# Patient Record
Sex: Female | Born: 1950 | Race: White | Hispanic: No | Marital: Married | Smoking: Never smoker
Health system: Southern US, Community
[De-identification: ages and names within clinical notes are randomized; demographics above are authoritative.]

## PROBLEM LIST (undated history)

## (undated) DIAGNOSIS — E079 Disorder of thyroid, unspecified: Secondary | ICD-10-CM

## (undated) DIAGNOSIS — F419 Anxiety disorder, unspecified: Secondary | ICD-10-CM

## (undated) DIAGNOSIS — E039 Hypothyroidism, unspecified: Secondary | ICD-10-CM

## (undated) DIAGNOSIS — M87076 Idiopathic aseptic necrosis of unspecified foot: Secondary | ICD-10-CM

## (undated) DIAGNOSIS — K219 Gastro-esophageal reflux disease without esophagitis: Secondary | ICD-10-CM

## (undated) DIAGNOSIS — E559 Vitamin D deficiency, unspecified: Secondary | ICD-10-CM

## (undated) DIAGNOSIS — I10 Essential (primary) hypertension: Secondary | ICD-10-CM

## (undated) DIAGNOSIS — M5030 Other cervical disc degeneration, unspecified cervical region: Secondary | ICD-10-CM

## (undated) DIAGNOSIS — M5432 Sciatica, left side: Secondary | ICD-10-CM

## (undated) DIAGNOSIS — E663 Overweight: Secondary | ICD-10-CM

## (undated) DIAGNOSIS — M810 Age-related osteoporosis without current pathological fracture: Secondary | ICD-10-CM

## (undated) DIAGNOSIS — M5431 Sciatica, right side: Secondary | ICD-10-CM

## (undated) DIAGNOSIS — M1711 Unilateral primary osteoarthritis, right knee: Secondary | ICD-10-CM

## (undated) DIAGNOSIS — M5136 Other intervertebral disc degeneration, lumbar region: Secondary | ICD-10-CM

## (undated) DIAGNOSIS — M51369 Other intervertebral disc degeneration, lumbar region without mention of lumbar back pain or lower extremity pain: Secondary | ICD-10-CM

## (undated) DIAGNOSIS — R7301 Impaired fasting glucose: Secondary | ICD-10-CM

## (undated) DIAGNOSIS — E042 Nontoxic multinodular goiter: Secondary | ICD-10-CM

## (undated) DIAGNOSIS — E785 Hyperlipidemia, unspecified: Secondary | ICD-10-CM

## (undated) HISTORY — DX: Overweight: E66.3

## (undated) HISTORY — DX: Other cervical disc degeneration, unspecified cervical region: M50.30

## (undated) HISTORY — DX: Nontoxic multinodular goiter: E04.2

## (undated) HISTORY — PX: MASTOIDECTOMY: SHX711

## (undated) HISTORY — DX: Hypothyroidism, unspecified: E03.9

## (undated) HISTORY — PX: INNER EAR SURGERY: SHX679

## (undated) HISTORY — DX: Sciatica, right side: M54.31

## (undated) HISTORY — PX: COLONOSCOPY: SHX174

## (undated) HISTORY — PX: ABDOMINAL HYSTERECTOMY: SHX81

## (undated) HISTORY — DX: Vitamin D deficiency, unspecified: E55.9

## (undated) HISTORY — PX: APPENDECTOMY: SHX54

## (undated) HISTORY — PX: ESOPHAGOGASTRODUODENOSCOPY: SHX1529

## (undated) HISTORY — PX: THYROIDECTOMY, PARTIAL: SHX18

## (undated) HISTORY — DX: Other intervertebral disc degeneration, lumbar region without mention of lumbar back pain or lower extremity pain: M51.369

## (undated) HISTORY — DX: Unilateral primary osteoarthritis, right knee: M17.11

## (undated) HISTORY — DX: Sciatica, right side: M54.32

## (undated) HISTORY — DX: Idiopathic aseptic necrosis of unspecified foot: M87.076

## (undated) HISTORY — DX: Other intervertebral disc degeneration, lumbar region: M51.36

## (undated) HISTORY — PX: TONSILLECTOMY: SUR1361

## (undated) HISTORY — DX: Age-related osteoporosis without current pathological fracture: M81.0

## (undated) HISTORY — DX: Impaired fasting glucose: R73.01

## (undated) HISTORY — DX: Hyperlipidemia, unspecified: E78.5

## (undated) HISTORY — DX: Anxiety disorder, unspecified: F41.9

---

## 2011-02-06 ENCOUNTER — Emergency Department (HOSPITAL_COMMUNITY): Payer: BC Managed Care – PPO

## 2011-02-06 ENCOUNTER — Emergency Department (HOSPITAL_COMMUNITY)
Admission: EM | Admit: 2011-02-06 | Discharge: 2011-02-06 | Disposition: A | Discharge status: Home / Self Care | Payer: BC Managed Care – PPO | Attending: Emergency Medicine | Admitting: Emergency Medicine

## 2011-02-06 DIAGNOSIS — R0602 Shortness of breath: Secondary | ICD-10-CM | POA: Insufficient documentation

## 2011-02-06 DIAGNOSIS — K219 Gastro-esophageal reflux disease without esophagitis: Secondary | ICD-10-CM | POA: Insufficient documentation

## 2011-02-06 DIAGNOSIS — R059 Cough, unspecified: Secondary | ICD-10-CM | POA: Insufficient documentation

## 2011-02-06 DIAGNOSIS — R071 Chest pain on breathing: Secondary | ICD-10-CM | POA: Insufficient documentation

## 2011-02-06 DIAGNOSIS — E785 Hyperlipidemia, unspecified: Secondary | ICD-10-CM | POA: Insufficient documentation

## 2011-02-06 DIAGNOSIS — R062 Wheezing: Secondary | ICD-10-CM | POA: Insufficient documentation

## 2011-02-06 DIAGNOSIS — Z79899 Other long term (current) drug therapy: Secondary | ICD-10-CM | POA: Insufficient documentation

## 2011-02-06 DIAGNOSIS — R05 Cough: Secondary | ICD-10-CM | POA: Insufficient documentation

## 2011-02-06 LAB — CBC
HCT: 40.7 % (ref 36.0–46.0)
Hemoglobin: 14.6 g/dL (ref 12.0–15.0)
MCH: 31.4 pg (ref 26.0–34.0)
MCHC: 35.9 g/dL (ref 30.0–36.0)
MCV: 87.5 fL (ref 78.0–100.0)

## 2011-02-06 LAB — COMPREHENSIVE METABOLIC PANEL
ALT: 28 U/L (ref 0–35)
AST: 22 U/L (ref 0–37)
Albumin: 4.6 g/dL (ref 3.5–5.2)
CO2: 24 mEq/L (ref 19–32)
Chloride: 103 mEq/L (ref 96–112)
GFR calc non Af Amer: >60 mL/min (ref >60)
Potassium: 3.4 mEq/L — ABNORMAL LOW (ref 3.5–5.1)
Sodium: 140 mEq/L (ref 135–145)
Total Bilirubin: 0.6 mg/dL (ref 0.3–1.2)

## 2011-02-06 LAB — DIFFERENTIAL
Basophils Relative: 0 % (ref 0–1)
Lymphocytes Relative: 27 % (ref 12–46)
Lymphs Abs: 1.9 10*3/uL (ref 0.7–4.0)
Monocytes Absolute: 0.4 10*3/uL (ref 0.1–1.0)
Monocytes Relative: 6 % (ref 3–12)
Neutro Abs: 4.7 10*3/uL (ref 1.7–7.7)

## 2011-02-06 LAB — CK TOTAL AND CKMB (NOT AT ARMC): Relative Index: INVALID (ref 0.0–2.5)

## 2011-02-06 LAB — TROPONIN I: Troponin I: <0.3 ng/mL (ref <0.30)

## 2011-02-06 MED ORDER — IOHEXOL 350 MG/ML SOLN
100.0000 mL | Freq: Once | INTRAVENOUS | Status: AC | PRN
  Administered 2011-02-06: 100 mL via INTRAVENOUS

## 2017-12-13 ENCOUNTER — Emergency Department (HOSPITAL_COMMUNITY): Payer: Medicare Other

## 2017-12-13 ENCOUNTER — Other Ambulatory Visit: Payer: Self-pay

## 2017-12-13 ENCOUNTER — Emergency Department (HOSPITAL_COMMUNITY)
Admission: EM | Admit: 2017-12-13 | Discharge: 2017-12-13 | Disposition: A | Discharge status: Home / Self Care | Payer: Medicare Other | Attending: Emergency Medicine | Admitting: Emergency Medicine

## 2017-12-13 ENCOUNTER — Encounter (HOSPITAL_COMMUNITY): Payer: Self-pay

## 2017-12-13 DIAGNOSIS — I10 Essential (primary) hypertension: Secondary | ICD-10-CM | POA: Diagnosis not present

## 2017-12-13 DIAGNOSIS — R1010 Upper abdominal pain, unspecified: Secondary | ICD-10-CM | POA: Diagnosis present

## 2017-12-13 HISTORY — DX: Disorder of thyroid, unspecified: E07.9

## 2017-12-13 HISTORY — DX: Gastro-esophageal reflux disease without esophagitis: K21.9

## 2017-12-13 HISTORY — DX: Essential (primary) hypertension: I10

## 2017-12-13 LAB — URINALYSIS, ROUTINE W REFLEX MICROSCOPIC
Bilirubin Urine: NEGATIVE
GLUCOSE, UA: NEGATIVE mg/dL
HGB URINE DIPSTICK: NEGATIVE
Ketones, ur: NEGATIVE mg/dL
Leukocytes, UA: NEGATIVE
Nitrite: NEGATIVE
Protein, ur: NEGATIVE mg/dL
SPECIFIC GRAVITY, URINE: 1.003 — AB (ref 1.005–1.030)
pH: 7 (ref 5.0–8.0)

## 2017-12-13 LAB — COMPREHENSIVE METABOLIC PANEL
ALBUMIN: 3.9 g/dL (ref 3.5–5.0)
ALT: 67 U/L — ABNORMAL HIGH (ref 14–54)
ANION GAP: 11 (ref 5–15)
AST: 29 U/L (ref 15–41)
Alkaline Phosphatase: 60 U/L (ref 38–126)
BUN: 12 mg/dL (ref 6–20)
CO2: 23 mmol/L (ref 22–32)
Calcium: 9.4 mg/dL (ref 8.9–10.3)
Chloride: 105 mmol/L (ref 101–111)
Creatinine, Ser: 0.69 mg/dL (ref 0.44–1.00)
GFR calc non Af Amer: >60 mL/min (ref >60)
GLUCOSE: 88 mg/dL (ref 65–99)
Potassium: 3.3 mmol/L — ABNORMAL LOW (ref 3.5–5.1)
SODIUM: 139 mmol/L (ref 135–145)
Total Bilirubin: 1 mg/dL (ref 0.3–1.2)
Total Protein: 7 g/dL (ref 6.5–8.1)

## 2017-12-13 LAB — CBC
HCT: 38.5 % (ref 36.0–46.0)
HEMOGLOBIN: 12.9 g/dL (ref 12.0–15.0)
MCH: 30.4 pg (ref 26.0–34.0)
MCHC: 33.5 g/dL (ref 30.0–36.0)
MCV: 90.6 fL (ref 78.0–100.0)
Platelets: 304 10*3/uL (ref 150–400)
RBC: 4.25 MIL/uL (ref 3.87–5.11)
RDW: 12.6 % (ref 11.5–15.5)
WBC: 11.9 10*3/uL — ABNORMAL HIGH (ref 4.0–10.5)

## 2017-12-13 LAB — LIPASE, BLOOD: Lipase: 37 U/L (ref 11–51)

## 2017-12-13 MED ORDER — SODIUM CHLORIDE 0.9 % IV BOLUS
1000.0000 mL | Freq: Once | INTRAVENOUS | Status: AC
  Administered 2017-12-13: 1000 mL via INTRAVENOUS

## 2017-12-13 MED ORDER — MORPHINE SULFATE (PF) 4 MG/ML IV SOLN
4.0000 mg | Freq: Once | INTRAVENOUS | Status: AC
  Administered 2017-12-13: 4 mg via INTRAVENOUS
  Filled 2017-12-13: qty 1

## 2017-12-13 MED ORDER — METOCLOPRAMIDE HCL 5 MG/ML IJ SOLN
10.0000 mg | Freq: Once | INTRAMUSCULAR | Status: AC
  Administered 2017-12-13: 10 mg via INTRAVENOUS
  Filled 2017-12-13: qty 2

## 2017-12-13 NOTE — ED Provider Notes (Signed)
[facility] EMERGENCY DEPARTMENT Provider Note   CSN: [id number] Arrival date & time: 12/13/17  0747     History   Chief Complaint Chief Complaint  Patient presents with  . Abdominal Pain    HPI Donna Dunn is a 67 y.o. female.  HPI   Donna Dunn is a 67 y.o. female who presents to the Emergency Department complaining of diffuse upper abdominal pain that began yesterday.  She describes the pain as constant and radiating across her upper abdomen.  Yesterday the pain radiated into her back.  She was seen at another hospital emergency department in [facility] and states that she received a CT of her abdomen, pain medication and laboratory studies.  She states that her pain was improved after morphine and she was discharged from the hospital.  She states pain had improved until she woke up this morning.  She was prescribed tramadol for her pain and she took 1 tablet this morning with some improvement.  She also reports a recurrence of 1 of her "silent headaches" she states the headache pain is similar to previous headaches in onset was after the abdominal pain.  She denies fever, vomiting, diarrhea,  pelvic pain, dysuria, pain affected by food intake, and chest pain.      Past Medical History:  Diagnosis Date  . GERD (gastroesophageal reflux disease)   . Hypertension   . Thyroid disease     There are no active problems to display for this patient.   Past Surgical History:  Procedure Laterality Date  . ABDOMINAL HYSTERECTOMY    . APPENDECTOMY    . THYROIDECTOMY, PARTIAL    . TONSILLECTOMY       OB History   None      Home Medications    Prior to Admission medications   Not on File    Family History No family history on file.  Social History Social History   Tobacco Use  . Smoking status: Never Smoker  Substance Use Topics  . Alcohol use: Never    Frequency: Never  . Drug use: Never     Allergies   Compazine [prochlorperazine  edisylate]   Review of Systems Review of Systems  Constitutional: Negative for appetite change, chills and fever.  Respiratory: Negative for shortness of breath.   Cardiovascular: Negative for chest pain.  Gastrointestinal: Positive for abdominal pain and nausea. Negative for blood in stool, diarrhea and vomiting.  Genitourinary: Negative for decreased urine volume, difficulty urinating, dysuria and flank pain.  Musculoskeletal: Negative for back pain.  Skin: Negative for color change and rash.  Neurological: Negative for dizziness, weakness and numbness.  Hematological: Negative for adenopathy.  All other systems reviewed and are negative.    Physical Exam Updated Vital Signs BP (!) 165/82   Pulse (!) 58   Temp 98.1 F (36.7 C) (Oral)   Resp 16   Wt 74.8 kg (165 lb)   SpO2 99%   Physical Exam  Constitutional: She is oriented to person, place, and time. She appears well-developed and well-nourished. She does not appear ill.  HENT:  Head: Atraumatic.  Mouth/Throat: Oropharynx is clear and moist.  Cardiovascular: Normal rate, regular rhythm, normal heart sounds and intact distal pulses.  No murmur heard. Pulmonary/Chest: Effort normal and breath sounds normal. No respiratory distress. She exhibits no tenderness.  Abdominal: Soft. She exhibits no distension and no mass. There is tenderness. There is no rebound and no guarding.  Diffuse ttp of the right and left upper abdomen.  No guarding or rebound tenderness.    Musculoskeletal: Normal range of motion.  Neurological: She is alert and oriented to person, place, and time. No sensory deficit.  Skin: Skin is warm. Capillary refill takes less than 2 seconds. No rash noted.  Psychiatric: She has a normal mood and affect.  Nursing note and vitals reviewed.    ED Treatments / Results  Labs (all labs ordered are listed, but only abnormal results are displayed) Labs Reviewed  COMPREHENSIVE METABOLIC PANEL - Abnormal; Notable  for the following components:      Result Value   Potassium 3.3 (*)    ALT 67 (*)    All other components within normal limits  CBC - Abnormal; Notable for the following components:   WBC 11.9 (*)    All other components within normal limits  URINALYSIS, ROUTINE W REFLEX MICROSCOPIC - Abnormal; Notable for the following components:   Color, Urine STRAW (*)    APPearance HAZY (*)    Specific Gravity, Urine 1.003 (*)    All other components within normal limits  LIPASE, BLOOD    EKG None  Radiology US Abdomen Complete  Result Date: 12/13/2017 CLINICAL DATA:  Upper abdominal pain EXAM: ABDOMEN ULTRASOUND COMPLETE COMPARISON:  None. FINDINGS: Gallbladder: No gallstones or wall thickening visualized. There is no pericholecystic fluid. No sonographic Murphy sign noted by sonographer. Common bile duct: Diameter: 4 mm. No intrahepatic, common hepatic, or common bile duct dilatation. Liver: No focal lesion identified. Liver echogenicity overall is increased. Portal vein is patent on color Doppler imaging with normal direction of blood flow towards the liver. IVC: No abnormality visualized. Pancreas: No pancreatic mass or inflammatory focus. Spleen: Size and appearance within normal limits. Right Kidney: Length: 10.7 cm. Echogenicity within normal limits. No mass or hydronephrosis visualized. Left Kidney: Length: 10.6 cm. Echogenicity within normal limits. No mass or hydronephrosis visualized. Abdominal aorta: No aneurysm visualized. There is aortic atherosclerosis. Other findings: No demonstrable ascites. IMPRESSION: 1. Overall increase in liver echogenicity, a finding felt to be indicative of hepatic steatosis. While no focal liver lesions are evident on this study, it must be cautioned that the sensitivity of ultrasound for detection of focal liver lesions is diminished in this circumstance. 2.  Aortic atherosclerosis. 3.  Study otherwise unremarkable. Aortic Atherosclerosis (ICD10-I70.0).  Electronically Signed   By: Lowella Grip III M.D.   On: 12/13/2017 10:25    Procedures Procedures (including critical care time)  Medications Ordered in ED Medications  sodium chloride 0.9 % bolus 1,000 mL (0 mLs Intravenous Stopped 12/13/17 1022)  metoCLOPramide (REGLAN) injection 10 mg (10 mg Intravenous Given 12/13/17 0856)  morphine 4 MG/ML injection 4 mg (4 mg Intravenous Given 12/13/17 0855)     Initial Impression / Assessment and Plan / ED Course  I have reviewed the triage vital signs and the nursing notes.  Pertinent labs & imaging results that were available during my care of the patient were reviewed by me and considered in my medical decision making (see chart for details).     Patient was seen for similar symptoms yesterday at another emergency department, I will try to obtain copies of her CT results and laboratory studies.  Patient is nontoxic-appearing.  Afebrile.  No vomiting or diarrhea.  Pt also seen by Dr. Lacinda Axon and care plan discussed.  I have reviewed laboratory studies and CT findings from patient's emergency department visit at [facility] yesterday on 12/12/2017.  Cardiac work-up from yesterday was negative.  Laboratory studies were reassuring  she had a mild leukocytosis that has improved today.  She is well-appearing.  Nontoxic.  Tolerating p.o. fluids no vomiting during ER stay today.  Pain has improved. Doubt acute abdomen or ACS.  CT abdomen and pelvis with contrast from yesterday's ER visit was read as no acute intra-abdominal or pelvic process colonic diverticulosis without diverticulitis she did have moderate amount of stool throughout the colon.  I feel patient is safe for discharge home, I have discussed to increase her water intake and try over-the-counter MiraLAX or Dulcolax as directed for mild constipation, also possible viral process.  I will also provide referral information for GI.  Patient request referral for Dr. Laural Golden.  Final Clinical  Impressions(s) / ED Diagnoses   Final diagnoses:  Pain of upper abdomen    ED Discharge Orders    None       Kem Parkinson, PA-C 12/13/17 1104    Nat Christen, MD 12/14/17 [phone]

## 2017-12-13 NOTE — Discharge Instructions (Addendum)
As discussed, try to increase your water intake and try taking over-the-counter MiraLAX or Dulcolax as directed for constipation.  Call Dr. Olevia Perches office to arrange a follow-up appointment

## 2017-12-13 NOTE — ED Triage Notes (Addendum)
Pt reports upper abdominal pain that began yesterday. Pt went to hosp in [place] yesterday. Pt was given nausea and pain med at discharge CT performed. Pt was given morphine in hospital. Pain improved. Woke up this morning and pain returned. Also reports headache

## 2017-12-14 ENCOUNTER — Encounter (INDEPENDENT_AMBULATORY_CARE_PROVIDER_SITE_OTHER): Payer: Self-pay | Admitting: Internal Medicine

## 2017-12-14 ENCOUNTER — Ambulatory Visit (INDEPENDENT_AMBULATORY_CARE_PROVIDER_SITE_OTHER): Payer: Medicare Other | Admitting: Internal Medicine

## 2017-12-14 VITALS — BP 152/84 | HR 64 | Temp 97.5°F | Ht 65.0 in | Wt 157.6 lb

## 2017-12-14 DIAGNOSIS — R1010 Upper abdominal pain, unspecified: Secondary | ICD-10-CM | POA: Diagnosis not present

## 2017-12-14 DIAGNOSIS — K5900 Constipation, unspecified: Secondary | ICD-10-CM | POA: Diagnosis not present

## 2017-12-14 NOTE — Progress Notes (Addendum)
   Subjective:    Patient ID: Donna Dunn, female    DOB: 10-24-1950, 67 y.o.   MRN: [id number]  HPI Referred by the ED for pain of upper abdomen. Symptoms began 2 days ago.  Radiated across her upper abdomen,. Pain radiated into her back.yesterday  Seen at [facility] ED Monday and underwent a CT. , CT abdomen/pelvis with CM revealed no acute process.  No fever, vomiting, diarrhea or chills.  She underwent an US abdomen which was unremarkable.  She tells me she feels fine. She does have pain now. She says when the pain occurs it is sudden. Symptoms did not occur while eating.  She says she is 100% better now.  She says the pain started after she tried to have a BM. She did not get relief after the BM.  Her stool was very hard yesterday. She has trouble with constipation. Stool sometimes are hard.  She has a BM every other day if she is lucky.  She has just started taking Miralax. Her last colonoscopy was in February of 2009 (screening) which was normal (Dr. West Carbo).   CBC    Component Value Date/Time   WBC 11.9 (H) 12/13/2017 0819   RBC 4.25 12/13/2017 0819   HGB 12.9 12/13/2017 0819   HCT 38.5 12/13/2017 0819   PLT 304 12/13/2017 0819   MCV 90.6 12/13/2017 0819   MCH 30.4 12/13/2017 0819   MCHC 33.5 12/13/2017 0819   RDW 12.6 12/13/2017 0819   LYMPHSABS 1.9 02/06/2011 1057   MONOABS 0.4 02/06/2011 1057   EOSABS 0.0 02/06/2011 1057   BASOSABS 0.0 02/06/2011 1057   Hepatic Function Latest Ref Rng & Units 12/13/2017 02/06/2011  Total Protein 6.5 - 8.1 g/dL 7.0 8.7(H)  Albumin 3.5 - 5.0 g/dL 3.9 4.6  AST 15 - 41 U/L 29 22  ALT 14 - 54 U/L 67(H) 28  Alk Phosphatase 38 - 126 U/L 60 76  Total Bilirubin 0.3 - 1.2 mg/dL 1.0 0.6   12/13/2017 Lipase 37.  Urinalysis was normal.  Review of Systems     Objective:   Physical Exam Blood pressure (!) 152/84, pulse 64, temperature (!) 97.5 F (36.4 C), height _0  (1.651 m), weight 157 lb 9.6 oz (71.5 kg). Alert and oriented. Skin warm  and dry. Oral mucosa is moist.   . Sclera anicteric, conjunctivae is pink. Thyroid not enlarged. No cervical lymphadenopathy. Lungs clear. Heart regular rate and rhythm.  Abdomen is soft. Bowel sounds are positive. No hepatomegaly. No abdominal masses felt. No tenderness.  No edema to lower extremities.           Assessment & Plan:  Upper abdominal pain. Will get a HIDA scan.  This possible could be related to constipation.  She will continue the MIralax. Further recommendations to follow.

## 2017-12-14 NOTE — Patient Instructions (Addendum)
HIDA scan.  Continue the MIralax

## 2017-12-19 ENCOUNTER — Encounter (HOSPITAL_COMMUNITY)
Admission: RE | Admit: 2017-12-19 | Discharge: 2017-12-19 | Disposition: A | Discharge status: Home / Self Care | Payer: Medicare Other | Source: Ambulatory Visit | Attending: Internal Medicine | Admitting: Internal Medicine

## 2017-12-19 ENCOUNTER — Encounter (HOSPITAL_COMMUNITY): Payer: Self-pay

## 2017-12-19 DIAGNOSIS — R1010 Upper abdominal pain, unspecified: Secondary | ICD-10-CM

## 2017-12-19 MED ORDER — TECHNETIUM TC 99M MEBROFENIN IV KIT
5.0000 | PACK | Freq: Once | INTRAVENOUS | Status: AC | PRN
  Administered 2017-12-19: 5.5 via INTRAVENOUS

## 2017-12-20 ENCOUNTER — Telehealth (INDEPENDENT_AMBULATORY_CARE_PROVIDER_SITE_OTHER): Payer: Self-pay | Admitting: Internal Medicine

## 2017-12-20 NOTE — Telephone Encounter (Signed)
Patient returned your call - [phone]

## 2017-12-20 NOTE — Telephone Encounter (Signed)
Results given to husband. No symptoms now. Will bring back in 8 weeks

## 2019-01-18 IMAGING — NM NM HEPATO W/GB/PHARM/[PERSON_NAME]
2 series · 12 of 12 positions shown · non-contrast
Comparison: Abdominal ultrasound 12/13/2017

CLINICAL DATA: Abdominal pain for 1 week.

EXAM:
NUCLEAR MEDICINE HEPATOBILIARY IMAGING WITH GALLBLADDER EF
TECHNIQUE: Sequential images of the abdomen were obtained [DATE] minutes
following intravenous administration of radiopharmaceutical. After
oral ingestion of Ensure, gallbladder ejection fraction was
determined. At 60 min, normal ejection fraction is greater than 33%.
RADIOPHARMACEUTICALS:  5.5 mCi Hc-99m  Choletec IV

[Series 1: biliary · 3.25mm/px · 6 of 60 frames shown]
[frame 6/60]
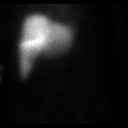
[frame 16/60]
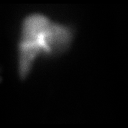
[frame 26/60]
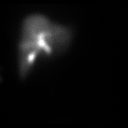
[frame 36/60]
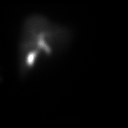
[frame 46/60]
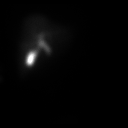
[frame 56/60]
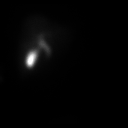

[Series 2: gbef · 3.25mm/px · 6 of 60 frames shown]
[frame 6/60]
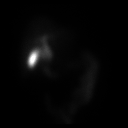
[frame 16/60]
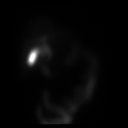
[frame 26/60]
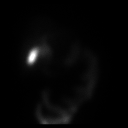
[frame 36/60]
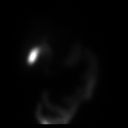
[frame 46/60]
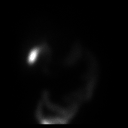
[frame 56/60]
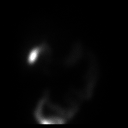

[12 of 12 positions shown; findings below may reference images not displayed]

FINDINGS: Prompt uptake and biliary excretion of activity by the liver is
seen. Gallbladder activity is visualized, consistent with patency of
cystic duct. Biliary activity passes into small bowel, consistent
with patent common bile duct.

Calculated gallbladder ejection fraction is 45%. (Normal gallbladder
ejection fraction with Ensure is greater than 33%.)
IMPRESSION: Normal scintigraphic evaluation of the gallbladder and liver.

## 2019-03-07 IMAGING — US US ABDOMEN COMPLETE
1 series · 13 of 25 positions shown · non-contrast
Comparison: None.

CLINICAL DATA: Upper abdominal pain

EXAM:
ABDOMEN ULTRASOUND COMPLETE

[Series 1: us abdomen complete · 0.21mm/px · 13 of 102 slices shown]
[im 1/102]
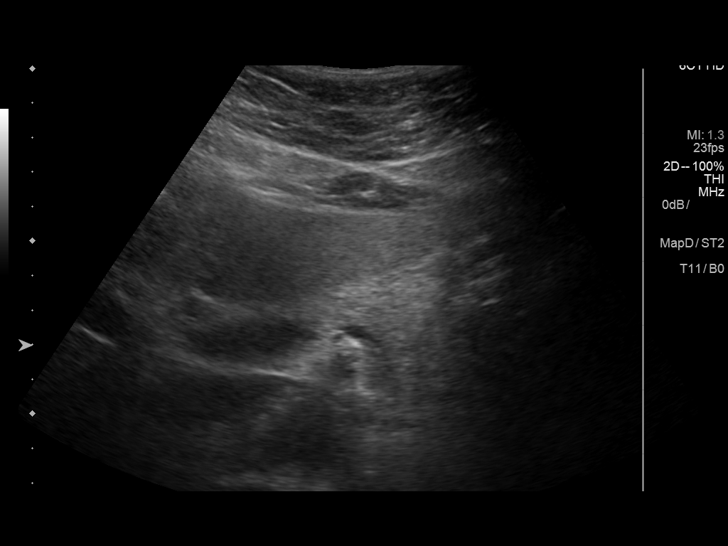
[im 9/102]
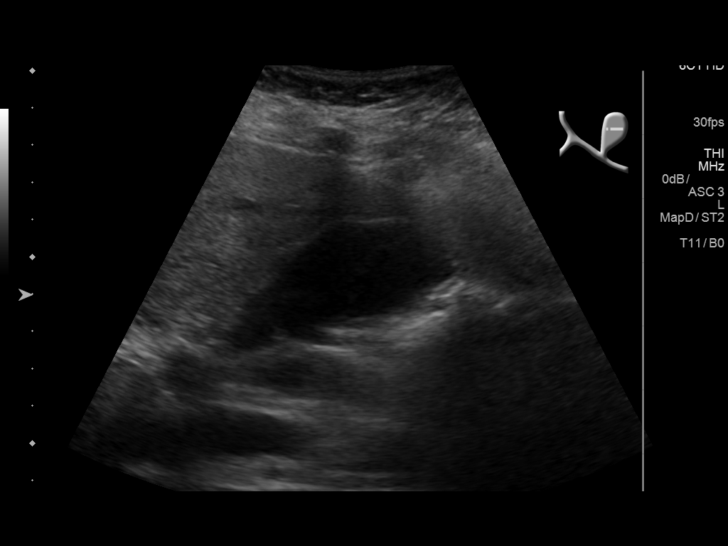
[im 17/102]
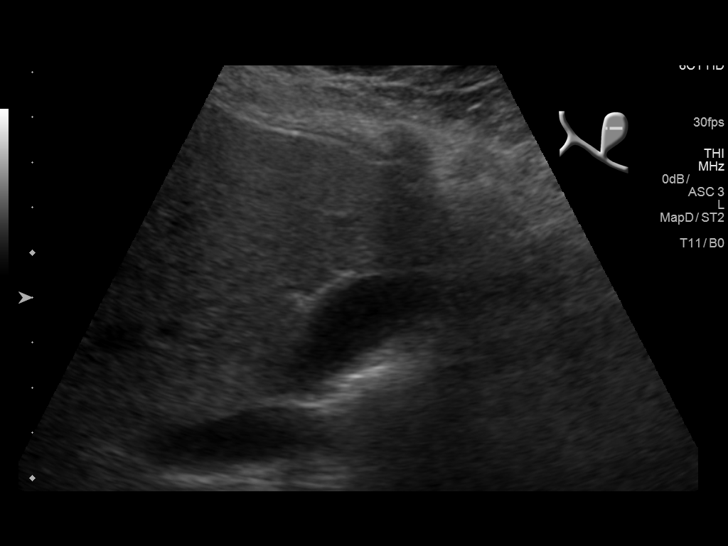
[im 26/102]
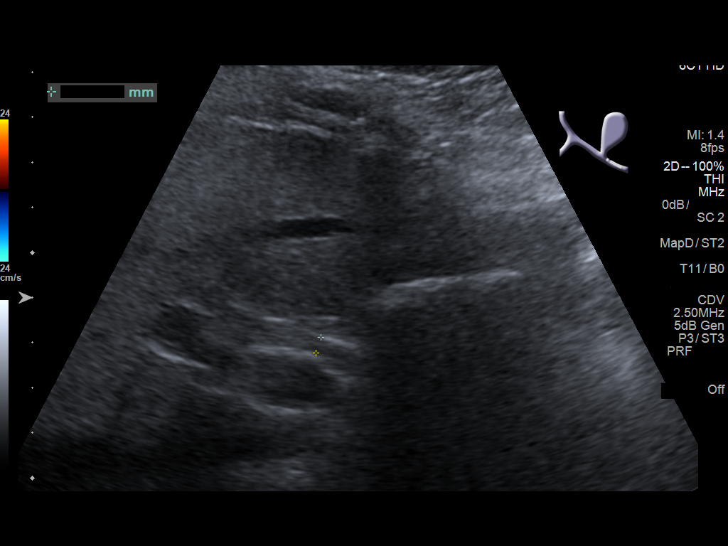
[im 34/102]
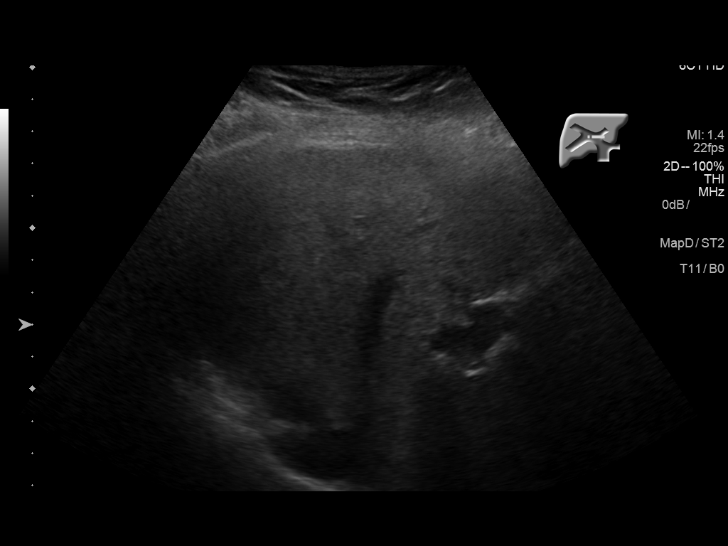
[im 43/102]
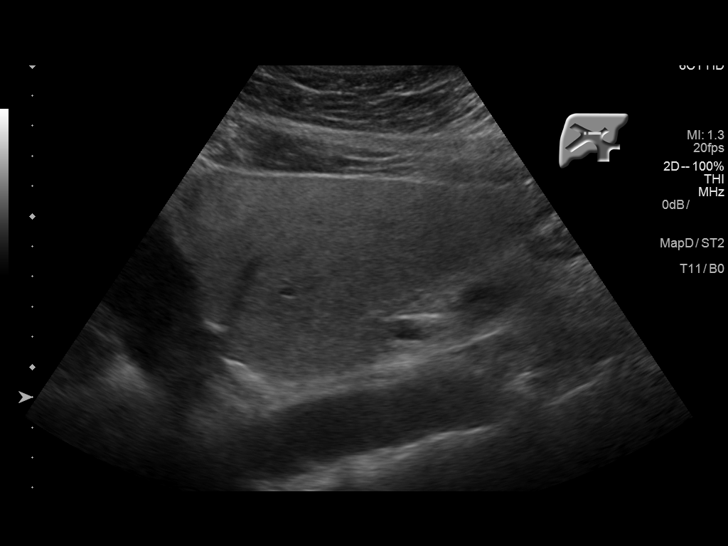
[im 51/102]
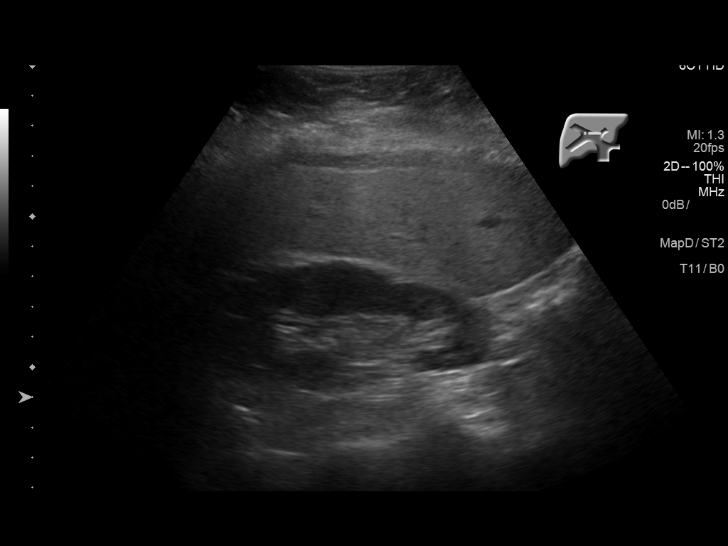
[im 59/102]
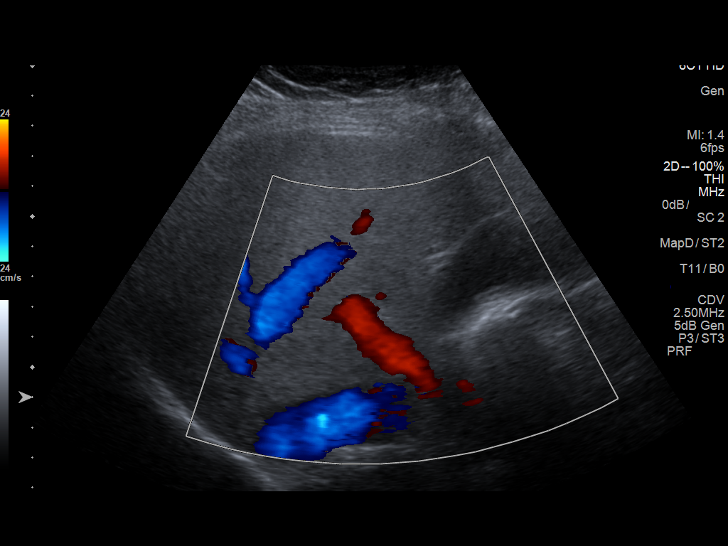
[im 68/102]
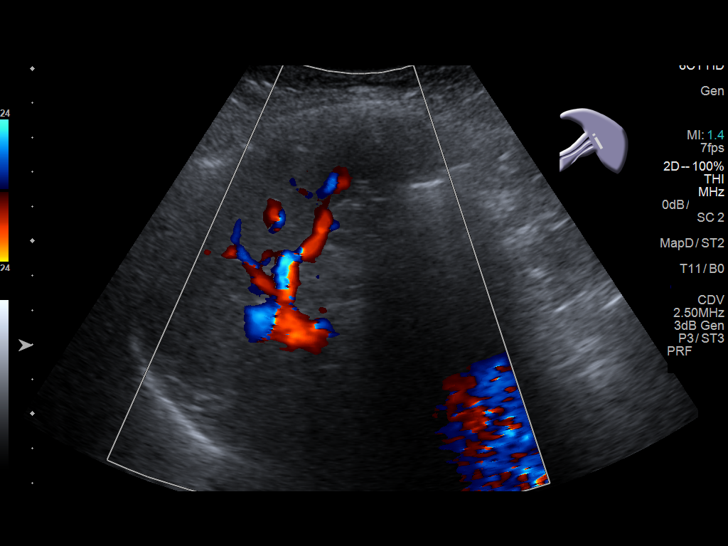
[im 76/102]
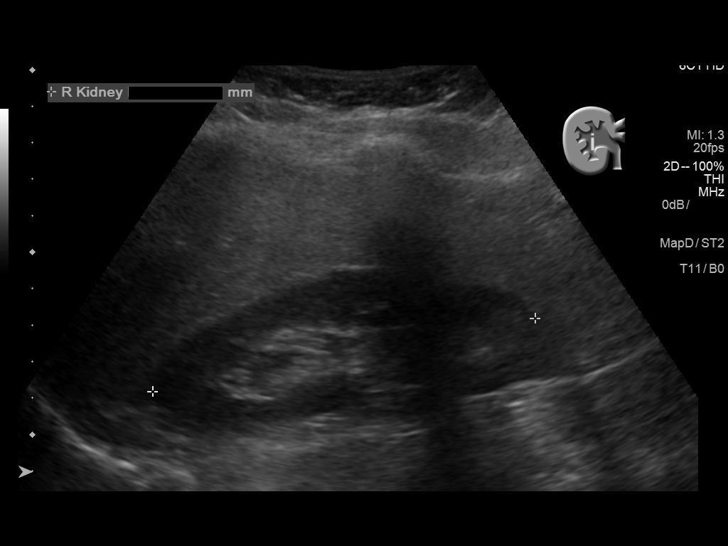
[im 85/102]
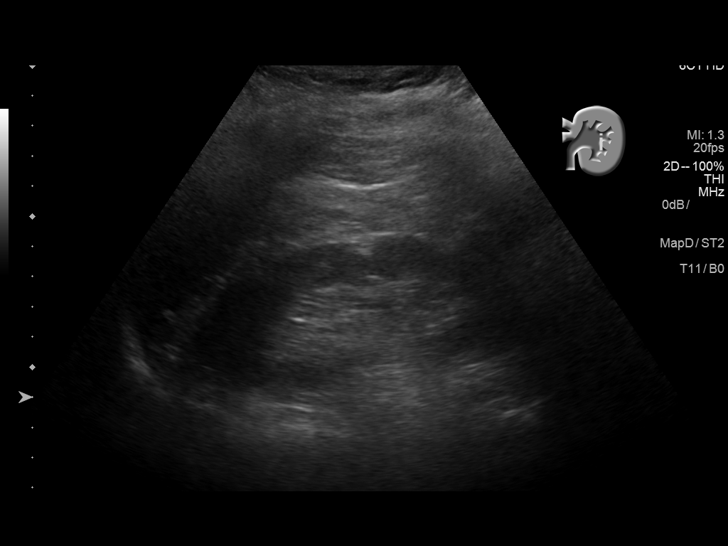
[im 93/102]
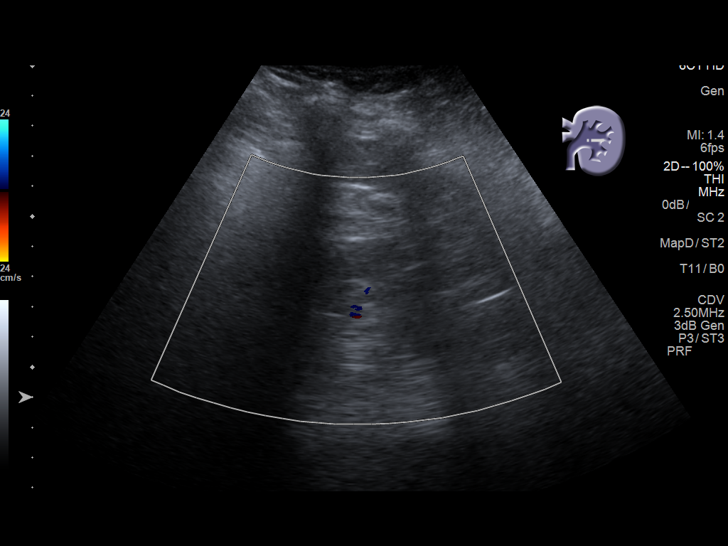
[im 102/102]
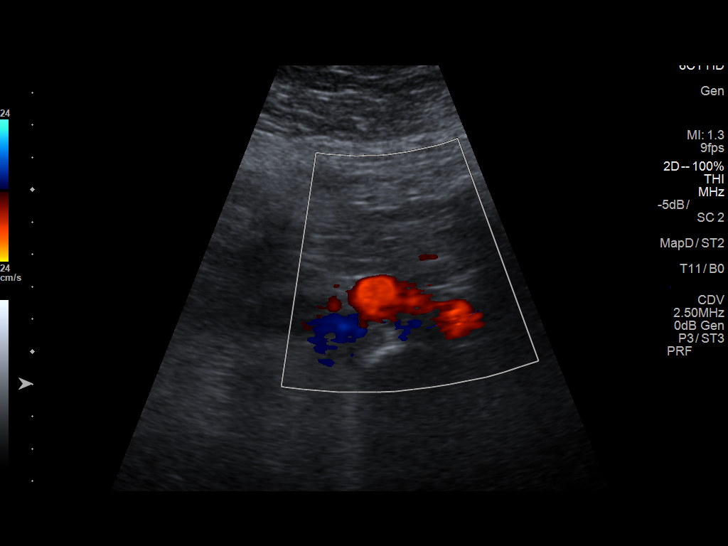

[13 of 25 positions shown; findings below may reference images not displayed]

FINDINGS: Gallbladder: No gallstones or wall thickening visualized. There is
no pericholecystic fluid. No sonographic Murphy sign noted by
sonographer.

Common bile duct: Diameter: 4 mm. No intrahepatic, common hepatic,
or common bile duct dilatation.

Liver: No focal lesion identified. Liver echogenicity overall is
increased.. Portal vein is patent on color Doppler imaging with
normal direction of blood flow towards the liver.

IVC: No abnormality visualized.

Pancreas: No pancreatic mass or inflammatory focus.

Spleen: Size and appearance within normal limits.

Right Kidney: Length: 10.7 cm. Echogenicity within normal limits. No
mass or hydronephrosis visualized.

Left Kidney: Length: 10.6 cm. Echogenicity within normal limits. No
mass or hydronephrosis visualized.

Abdominal aorta: No aneurysm visualized. There is aortic
atherosclerosis.

Other findings: No demonstrable ascites.
IMPRESSION: 1. Overall increase in liver echogenicity, a finding felt to be
indicative of hepatic steatosis. While no focal liver lesions are
evident on this study, it must be cautioned that the sensitivity of
ultrasound for detection of focal liver lesions is diminished in
this circumstance.

2.  Aortic atherosclerosis.

3.  Study otherwise unremarkable.

Aortic Atherosclerosis ([id number]-YNX.X).

## 2020-01-08 ENCOUNTER — Encounter: Payer: Self-pay | Admitting: Internal Medicine

## 2020-03-05 ENCOUNTER — Ambulatory Visit (INDEPENDENT_AMBULATORY_CARE_PROVIDER_SITE_OTHER): Payer: Medicare Other | Admitting: Internal Medicine

## 2020-03-05 ENCOUNTER — Encounter: Payer: Self-pay | Admitting: Internal Medicine

## 2020-03-05 VITALS — BP 148/84 | HR 70 | Ht 65.0 in | Wt 157.0 lb

## 2020-03-05 DIAGNOSIS — N8110 Cystocele, unspecified: Secondary | ICD-10-CM

## 2020-03-05 DIAGNOSIS — K5909 Other constipation: Secondary | ICD-10-CM

## 2020-03-05 DIAGNOSIS — R1310 Dysphagia, unspecified: Secondary | ICD-10-CM

## 2020-03-05 DIAGNOSIS — R0989 Other specified symptoms and signs involving the circulatory and respiratory systems: Secondary | ICD-10-CM

## 2020-03-05 DIAGNOSIS — Z1211 Encounter for screening for malignant neoplasm of colon: Secondary | ICD-10-CM | POA: Diagnosis not present

## 2020-03-05 DIAGNOSIS — R6889 Other general symptoms and signs: Secondary | ICD-10-CM

## 2020-03-05 MED ORDER — NA SULFATE-K SULFATE-MG SULF 17.5-3.13-1.6 GM/177ML PO SOLN: 1.0000 | Freq: Once | ORAL | 0 refills | Status: AC

## 2020-03-05 NOTE — Progress Notes (Signed)
Donna Dunn 69 y.o. 1950-09-26 [id number]  Assessment & Plan:   Encounter Diagnoses  Name Primary?  . Chronic throat clearing Yes  . Pill dysphagia - rare   . Colon cancer screening   . Chronic constipation   . Bladder prolapse, female, acquired     The throat clearing may be a habit, I do not think it is related to reflux she was reassured.  Probably not related to prior surgery either.  The rare pill dysphagia does not seem to be a major issue she will monitor this.  I have advised against an upper endoscopy after explaining to her that I did not think we would find any significant abnormalities and she appeared relieved and reassured and excepting.  We talked about colon cancer screening, and we will plan for a screening colonoscopy.  Plan for functional rectal exam prior to sedation given her constipation issues.  Consider change in fiber intake, she is not on a supplement I am not sure she is on a high-fiber diet, await colonoscopy and will discuss further.  She has not tried anything other than occasional Dulcolax.  Certainly could have some pelvic floor issues since he has some bladder prolapse reported.  The risks and benefits as well as alternatives of endoscopic procedure(s) have been discussed and reviewed. All questions answered. The patient agrees to proceed.   I appreciate the opportunity to care for this patient. CC: Earney Mallet, MD   Subjective:   Chief Complaint: Throat clearing pill dysphagia bloating HPI The patient is a 69 year old married woman here with her husband with complaints of chronic throat clearing, chronic postprandial bloating and concerned that there might be a problem that needs to be "checked out".  She takes PPI with good relief of heartburn symptoms.  Previous endoscopy in [place] number of years ago without significant abnormality per patient report.  Rare Tums usage.  She had a subtotal thyroidectomy for a multinodular goiter a number  of years ago and after that at some point she has developed some rare pill dysphagia where the pill feels like it gets stuck in the side of her throat.  She does not have any cough when she swallows her liquid dysphagia or any food dysphagia.  She tends to bloat and feel full after eating but this is a chronic issue her weight fluctuates but there is no steady decline.  Her last colonoscopy was over 10 years ago and was negative.  She has not had stool testing in the interim after 10 years.  She does report she has a slightly dropped bladder that is being watched.  She moves her bowels about every other day and once or twice a month may need to take a stimulant laxative like Dulcolax.  I have reviewed visit with Dr. Macarthur Critchley from 12/20/2018 1 GI review of systems is otherwise negative Allergies  Allergen Reactions  . Compazine [Prochlorperazine Edisylate] Other (See Comments)    seizures   Current Meds  Medication Sig  . atorvastatin (LIPITOR) 20 MG tablet Take 20 mg by mouth daily.  . carvedilol (COREG) 6.25 MG tablet Take 6.25 mg by mouth 2 (two) times daily with a meal.  . Cholecalciferol (VITAMIN D3 PO) Take by mouth daily. 2000 units  . levothyroxine (SYNTHROID, LEVOTHROID) 75 MCG tablet 1 TABLET ON AN EMPTY STOMACH IN THE MORNING ONCE A DAY ORALLY 90 DAYS  . losartan (COZAAR) 25 MG tablet Take 25 mg by mouth daily.  . Multiple Vitamins-Minerals (ONE-A-DAY WOMENS 50+  ADVANTAGE PO) Take 1 tablet by mouth daily.  Marland Kitchen omeprazole (PRILOSEC) 40 MG capsule Take 40 mg by mouth daily.   Past Medical History:  Diagnosis Date  . Anxiety   . Avascular necrosis of metatarsal bone ([place])   . Bilateral sciatica   . DDD (degenerative disc disease), cervical   . DDD (degenerative disc disease), lumbar   . Fasting hyperglycemia   . GERD (gastroesophageal reflux disease)   . Hyperlipidemia   . Hypertension   . Hypothyroidism   . Multinodular goiter    left  . Osteoarthritis of right knee   . Osteoporosis  of lumbar spine   . Overweight (BMI 25.0-29.9)   . Vitamin D deficiency    Past Surgical History:  Procedure Laterality Date  . ABDOMINAL HYSTERECTOMY     complete  . APPENDECTOMY    . COLONOSCOPY    . ESOPHAGOGASTRODUODENOSCOPY    . INNER EAR SURGERY    . MASTOIDECTOMY Bilateral   . THYROIDECTOMY, PARTIAL Right   . TONSILLECTOMY     Social History   Social History Narrative   Patient is married and retired 3 daughters   Did Chiropractor work   Never smoker no alcohol no drug use   family history includes CAD in her brother and mother; CVA in her mother; Congestive Heart Failure in her father; Hypertension in her sister; Other in her father and sister; Prostate cancer in her brother.   Review of Systems As per HPI some decreased hearing all other review of systems are negative  Objective:   Physical Exam BP (!) 148/84   Pulse 70   Ht 5\' 5"  (1.651 m)   Wt 157 lb (71.2 kg)   BMI 26.13 kg/m  NAD Neck supple no TM/mass or lymphadenopathy Lungs cta Cor NL abd soft and NT BS+ no mass/HSM Rectal - deferred Alert and oriented x 3 Slightly anxious mood

## 2020-03-05 NOTE — Patient Instructions (Signed)
You have been scheduled for a colonoscopy. Please follow written instructions given to you at your visit today.  Please pick up your prep supplies at the pharmacy within the next 1-3 days. If you use inhalers (even only as needed), please bring them with you on the day of your procedure.   Normal BMI (Body Mass Index- based on height and weight) is between 23 and 30. Your BMI today is Body mass index is 26.13 kg/m. Marland Kitchen Please consider follow up  regarding your BMI with your Primary Care Provider.   I appreciate the opportunity to care for you. Silvano Rusk, MD, [facility]

## 2020-03-17 ENCOUNTER — Ambulatory Visit (INDEPENDENT_AMBULATORY_CARE_PROVIDER_SITE_OTHER): Payer: Medicare Other | Admitting: Gastroenterology

## 2020-03-17 ENCOUNTER — Other Ambulatory Visit: Payer: Self-pay

## 2020-03-17 ENCOUNTER — Telehealth (INDEPENDENT_AMBULATORY_CARE_PROVIDER_SITE_OTHER): Payer: Self-pay | Admitting: *Deleted

## 2020-03-17 ENCOUNTER — Other Ambulatory Visit (INDEPENDENT_AMBULATORY_CARE_PROVIDER_SITE_OTHER): Payer: Self-pay | Admitting: *Deleted

## 2020-03-17 ENCOUNTER — Encounter (INDEPENDENT_AMBULATORY_CARE_PROVIDER_SITE_OTHER): Payer: Self-pay | Admitting: *Deleted

## 2020-03-17 ENCOUNTER — Encounter (INDEPENDENT_AMBULATORY_CARE_PROVIDER_SITE_OTHER): Payer: Self-pay | Admitting: Gastroenterology

## 2020-03-17 VITALS — BP 134/76 | HR 64 | Temp 97.9°F | Resp 14 | Ht 65.0 in | Wt 158.0 lb

## 2020-03-17 DIAGNOSIS — K219 Gastro-esophageal reflux disease without esophagitis: Secondary | ICD-10-CM | POA: Diagnosis not present

## 2020-03-17 DIAGNOSIS — R1310 Dysphagia, unspecified: Secondary | ICD-10-CM | POA: Diagnosis not present

## 2020-03-17 DIAGNOSIS — R1013 Epigastric pain: Secondary | ICD-10-CM

## 2020-03-17 MED ORDER — PANTOPRAZOLE SODIUM 40 MG PO TBEC: 40.0000 mg | DELAYED_RELEASE_TABLET | Freq: Every day | ORAL | 3 refills | Status: DC

## 2020-03-17 MED ORDER — PLENVU 140 G PO SOLR: 1.0000 | Freq: Once | ORAL | 0 refills | Status: AC

## 2020-03-17 NOTE — Progress Notes (Signed)
Patient profile: Donna Dunn is a 69 y.o. female seen for evaluation of dysphagia, epigastric pain. Last seen clinic 12/2017.   History of Present Illness: Donna Dunn is seen today for upper abd discomfort, occurs daily, can wake up with discomfort some mornings, other times starts after coffee, feels "bloated and anxious" in her abdomen. Sometimes food will make pain worse but not always. Coffee does tend to make pain worse (drinks 3 cups/day). States this is a different pain than the abd pain she had in 2019. On omeprazole 40mg  daily for GERD for 20 years per patient, GERD acts up w/ spicy/greasy goods or overeating but otherwise dose well. No nausea/vomiting. Reports frequent throat clearing and wonders if reflux related. She describes occasional dysphagia in her upper esophageal area, this can occur with pills or substances such as ice cream, symptoms can cause her to stop eating due to dysphagia.  Normally bowel habits are baseline constipation, takes dulcolax every few days for constipation. No blood in stool. No severe abd pain.  Denies rectal bleeding.  Currently on Augmentin and stools are looser.  Takes excedrin rarely for migraines. Non smoker. No alcohol.    Last EGD 15+ years ago - Dr West Carbo.    Reports used to weight 162-165# but has lost weight due to the upper abd pain.   Sister w/ hx of colon polyps-sister accompanies her to visit.   Paternal grandmother w/ colon cancer-70s  Father sister-diagnosed at age 11 w/ colon cancer.   Wt Readings from Last 3 Encounters:  03/17/20 158 lb (71.7 kg)  03/05/20 157 lb (71.2 kg)  12/14/17 157 lb 9.6 oz (71.5 kg)     Last Colonoscopy: 2009-normal Last Endoscopy: Per patient at least 15 years ago.   Past Medical History:  Past Medical History:  Diagnosis Date  . Anxiety   . Avascular necrosis of metatarsal bone ([place])   . Bilateral sciatica   . DDD (degenerative disc disease), cervical   . DDD (degenerative disc disease),  lumbar   . Fasting hyperglycemia   . GERD (gastroesophageal reflux disease)   . Hyperlipidemia   . Hypertension   . Hypothyroidism   . Multinodular goiter    left  . Osteoarthritis of right knee   . Osteoporosis of lumbar spine   . Overweight (BMI 25.0-29.9)   . Vitamin D deficiency     Problem List: There are no problems to display for this patient.   Past Surgical History: Past Surgical History:  Procedure Laterality Date  . ABDOMINAL HYSTERECTOMY     complete  . APPENDECTOMY    . COLONOSCOPY    . ESOPHAGOGASTRODUODENOSCOPY    . INNER EAR SURGERY    . MASTOIDECTOMY Bilateral   . THYROIDECTOMY, PARTIAL Right   . TONSILLECTOMY      Allergies: Allergies  Allergen Reactions  . Compazine [Prochlorperazine Edisylate] Other (See Comments)    seizures      Home Medications:  Current Outpatient Medications:  .  atorvastatin (LIPITOR) 20 MG tablet, Take 20 mg by mouth daily., Disp: , Rfl: 3 .  carvedilol (COREG) 6.25 MG tablet, Take 6.25 mg by mouth 2 (two) times daily with a meal., Disp: , Rfl:  .  Cholecalciferol (VITAMIN D3 PO), Take by mouth daily. 2000 units, Disp: , Rfl:  .  levothyroxine (SYNTHROID, LEVOTHROID) 75 MCG tablet, 1 TABLET ON AN EMPTY STOMACH IN THE MORNING ONCE A DAY ORALLY 90 DAYS, Disp: , Rfl: 0 .  losartan (COZAAR) 25 MG tablet, Take  25 mg by mouth daily., Disp: , Rfl:  .  Multiple Vitamins-Minerals (ONE-A-DAY WOMENS 50+ ADVANTAGE PO), Take 1 tablet by mouth daily., Disp: , Rfl:  .  omeprazole (PRILOSEC) 40 MG capsule, Take 40 mg by mouth daily., Disp: , Rfl: 4 .  pantoprazole (PROTONIX) 40 MG tablet, Take 1 tablet (40 mg total) by mouth daily. Take 30 min before meal., Disp: 30 tablet, Rfl: 3   Family History: family history includes CAD in her brother and mother; CVA in her mother; Congestive Heart Failure in her father; Hypertension in her sister; Other in her father and sister; Prostate cancer in her brother.    Social History:   reports that  she has never smoked. She has never used smokeless tobacco. She reports that she does not drink alcohol and does not use drugs.   Review of Systems: Constitutional: + weight loss  Eyes: No changes in vision. ENT: No oral lesions, sore throat.  GI: see HPI.  Heme/Lymph: No easy bruising.  CV: No chest pain.  GU: No hematuria.  Integumentary: No rashes.  Neuro: No headaches.  Psych: No depression/anxiety.  Endocrine: No heat/cold intolerance.  Allergic/Immunologic: No urticaria.  Resp: No cough, SOB.  Musculoskeletal: No joint swelling.    Physical Examination: BP 134/76   Pulse 64   Temp 97.9 F (36.6 C)   Resp 14   Ht 5\' 5"  (1.651 m)   Wt 158 lb (71.7 kg)   BMI 26.29 kg/m  Gen: NAD, alert and oriented x 4 HEENT: PEERLA, EOMI, Neck: supple, no JVD Chest: CTA bilaterally, no wheezes, crackles, or other adventitious sounds CV: RRR, no m/g/c/r Abd: soft, NT, ND, +BS in all four quadrants; no HSM, guarding, ridigity, or rebound tenderness Ext: no edema, well perfused with 2+ pulses, Skin: no rash or lesions noted on observed skin Lymph: no noted LAD  Data Reviewed:  HIDA 12/2017: Calculated gallbladder ejection fraction is 45%. (Normal gallbladder ejection fraction with Ensure is greater than 33%. IMPRESSION: Normal scintigraphic evaluation of the gallbladder and liver.  Most recent labs requested from Dr Macarthur Critchley  Assessment/Plan: Ms. Digioia is a 69 y.o. female   1. Dyspepsia/GERD/Dysphagia-symptoms occurring frequently, she is on PPI. Did recommend decreasing coffee.  Given per her reports she lost about 5 pounds as well as having mild dysphagia will schedule endoscopy at time of colonoscopy.  She reports being on omeprazole for many years-will switch her to a trial of pantoprazole. Until EGD  2.  Colon cancer screen-last colonoscopy 2009.  Schedule. No LGI alarm symptoms.   Patient denies CP, SOB, and use of blood thinners. I discussed the risks and benefits of  procedure including bleeding, perforation, infection, missed lesions, medication reactions and possible hospitalization or surgery if complications. All questions answered.  She denies prior issues w/ sedation    Porschea was seen today for follow-up.  Diagnoses and all orders for this visit:  Dysphagia, unspecified type  Chronic GERD  Epigastric pain  Other orders -     pantoprazole (PROTONIX) 40 MG tablet; Take 1 tablet (40 mg total) by mouth daily. Take 30 min before meal.      I personally performed the service, non-incident to. (WP)  Laurine Blazer, [facility]

## 2020-03-17 NOTE — Patient Instructions (Signed)
We are switching from omeparzole to pantoprazole - stop omeprazole when picking up pantoprazole  We are scheduling endoscopy/colonoscopy for evaluation

## 2020-03-17 NOTE — Telephone Encounter (Signed)
Patient needs Plenvu (copay card) ° °

## 2020-03-24 ENCOUNTER — Encounter: Payer: Medicare Other | Admitting: Internal Medicine

## 2020-04-22 ENCOUNTER — Other Ambulatory Visit: Payer: Self-pay

## 2020-04-22 ENCOUNTER — Other Ambulatory Visit (HOSPITAL_COMMUNITY)
Admission: RE | Admit: 2020-04-22 | Discharge: 2020-04-22 | Disposition: A | Discharge status: Home / Self Care | Payer: Medicare Other | Source: Ambulatory Visit | Attending: Internal Medicine | Admitting: Internal Medicine

## 2020-04-23 NOTE — Progress Notes (Signed)
Called patient to ask if she could come in for a covid retest this afternoon. Pt. Lives in [place].  Pt.'s specimen went to wrong lab. Pt. was very understanding. I explained to her the lab would do a rapid test in the morning and get the results in 40 minutes or so. Nothing further needed. I also let Threasa Beards know about Mrs. Meno and her other patients.

## 2020-04-24 ENCOUNTER — Ambulatory Visit (HOSPITAL_COMMUNITY)
Admission: RE | Admit: 2020-04-24 | Discharge: 2020-04-24 | Disposition: A | Discharge status: Home / Self Care | Payer: Medicare Other | Attending: Internal Medicine | Admitting: Internal Medicine

## 2020-04-24 ENCOUNTER — Encounter (HOSPITAL_COMMUNITY): Payer: Self-pay | Admitting: Internal Medicine

## 2020-04-24 ENCOUNTER — Encounter (HOSPITAL_COMMUNITY)
Admission: RE | Disposition: A | Discharge status: Home / Self Care | Payer: Self-pay | Source: Home / Self Care | Attending: Internal Medicine

## 2020-04-24 ENCOUNTER — Other Ambulatory Visit: Payer: Self-pay

## 2020-04-24 DIAGNOSIS — E663 Overweight: Secondary | ICD-10-CM | POA: Diagnosis not present

## 2020-04-24 DIAGNOSIS — R1314 Dysphagia, pharyngoesophageal phase: Secondary | ICD-10-CM | POA: Diagnosis present

## 2020-04-24 DIAGNOSIS — R1310 Dysphagia, unspecified: Secondary | ICD-10-CM

## 2020-04-24 DIAGNOSIS — Z79899 Other long term (current) drug therapy: Secondary | ICD-10-CM | POA: Insufficient documentation

## 2020-04-24 DIAGNOSIS — E039 Hypothyroidism, unspecified: Secondary | ICD-10-CM | POA: Insufficient documentation

## 2020-04-24 DIAGNOSIS — Z20822 Contact with and (suspected) exposure to covid-19: Secondary | ICD-10-CM | POA: Diagnosis not present

## 2020-04-24 DIAGNOSIS — I10 Essential (primary) hypertension: Secondary | ICD-10-CM | POA: Diagnosis not present

## 2020-04-24 DIAGNOSIS — K228 Other specified diseases of esophagus: Secondary | ICD-10-CM | POA: Diagnosis not present

## 2020-04-24 DIAGNOSIS — D123 Benign neoplasm of transverse colon: Secondary | ICD-10-CM | POA: Insufficient documentation

## 2020-04-24 DIAGNOSIS — Z1211 Encounter for screening for malignant neoplasm of colon: Secondary | ICD-10-CM | POA: Diagnosis not present

## 2020-04-24 DIAGNOSIS — R1013 Epigastric pain: Secondary | ICD-10-CM

## 2020-04-24 DIAGNOSIS — K219 Gastro-esophageal reflux disease without esophagitis: Secondary | ICD-10-CM

## 2020-04-24 DIAGNOSIS — E785 Hyperlipidemia, unspecified: Secondary | ICD-10-CM | POA: Diagnosis not present

## 2020-04-24 DIAGNOSIS — Z79890 Hormone replacement therapy (postmenopausal): Secondary | ICD-10-CM | POA: Diagnosis not present

## 2020-04-24 DIAGNOSIS — E559 Vitamin D deficiency, unspecified: Secondary | ICD-10-CM | POA: Diagnosis not present

## 2020-04-24 DIAGNOSIS — K644 Residual hemorrhoidal skin tags: Secondary | ICD-10-CM | POA: Diagnosis not present

## 2020-04-24 DIAGNOSIS — K5730 Diverticulosis of large intestine without perforation or abscess without bleeding: Secondary | ICD-10-CM | POA: Diagnosis not present

## 2020-04-24 DIAGNOSIS — D130 Benign neoplasm of esophagus: Secondary | ICD-10-CM | POA: Diagnosis not present

## 2020-04-24 HISTORY — PX: ESOPHAGOGASTRODUODENOSCOPY: SHX5428

## 2020-04-24 HISTORY — PX: ESOPHAGEAL DILATION: SHX303

## 2020-04-24 HISTORY — PX: POLYPECTOMY: SHX5525

## 2020-04-24 HISTORY — PX: COLONOSCOPY: SHX5424

## 2020-04-24 LAB — SARS CORONAVIRUS 2 BY RT PCR (HOSPITAL ORDER, PERFORMED IN ~~LOC~~ HOSPITAL LAB): SARS Coronavirus 2: NEGATIVE

## 2020-04-24 LAB — HM COLONOSCOPY

## 2020-04-24 SURGERY — COLONOSCOPY
Anesthesia: Moderate Sedation

## 2020-04-24 MED ORDER — LIDOCAINE VISCOUS HCL 2 % MT SOLN
OROMUCOSAL | Status: AC
  Filled 2020-04-24: qty 15

## 2020-04-24 MED ORDER — MEPERIDINE HCL 50 MG/ML IJ SOLN
INTRAMUSCULAR | Status: AC
  Filled 2020-04-24: qty 1

## 2020-04-24 MED ORDER — SODIUM CHLORIDE 0.9 % IV SOLN: INTRAVENOUS | Status: DC

## 2020-04-24 MED ORDER — LIDOCAINE VISCOUS HCL 2 % MT SOLN
OROMUCOSAL | Status: DC | PRN
  Administered 2020-04-24: 5 mL via OROMUCOSAL

## 2020-04-24 MED ORDER — MEPERIDINE HCL 50 MG/ML IJ SOLN
INTRAMUSCULAR | Status: DC | PRN
Start: 2020-04-24 — End: 2020-04-24
  Administered 2020-04-24 (×2): 25 mg via INTRAVENOUS

## 2020-04-24 MED ORDER — MIDAZOLAM HCL 5 MG/5ML IJ SOLN
INTRAMUSCULAR | Status: AC
  Filled 2020-04-24: qty 10

## 2020-04-24 MED ORDER — MIDAZOLAM HCL 5 MG/5ML IJ SOLN
INTRAMUSCULAR | Status: DC | PRN
  Administered 2020-04-24: 1 mg via INTRAVENOUS
  Administered 2020-04-24 (×2): 2 mg via INTRAVENOUS
  Administered 2020-04-24 (×3): 1 mg via INTRAVENOUS

## 2020-04-24 NOTE — Op Note (Signed)
[facility] Patient Name: Donna Dunn Procedure Date: 04/24/2020 12:47 PM MRN: [id number] Date of Birth: [birth year] Attending MD: Hildred Laser , MD CSN: [id number] Age: 69 Admit Type: Outpatient Procedure:                Upper GI endoscopy Indications:              Esophageal dysphagia, Gastro-esophageal reflux                            disease Providers:                Hildred Laser, MD, Otis Peak B. Sharon Seller, RN, Caprice Kluver Referring MD:             Cherly Anderson. Pomposini, MD Medicines:                Lidocaine spray, Meperidine 50 mg IV, Midazolam 7                            mg IV Complications:            No immediate complications. Estimated Blood Loss:     Estimated blood loss was minimal. Procedure:                Pre-Anesthesia Assessment:                           - Prior to the procedure, a History and Physical                            was performed, and patient medications and                            allergies were reviewed. The patient's tolerance of                            previous anesthesia was also reviewed. The risks                            and benefits of the procedure and the sedation                            options and risks were discussed with the patient.                            All questions were answered, and informed consent                            was obtained. Prior Anticoagulants: The patient has                            taken no previous anticoagulant or antiplatelet                            agents  except for aspirin. ASA Grade Assessment: II                            - A patient with mild systemic disease. After                            reviewing the risks and benefits, the patient was                            deemed in satisfactory condition to undergo the                            procedure.                           After obtaining informed consent, the endoscope was                             passed under direct vision. Throughout the                            procedure, the patient's blood pressure, pulse, and                            oxygen saturations were monitored continuously. The                            [id number]-[id number] ([id number]) scope was introduced through the                            mouth, and advanced to the second part of duodenum.                            The upper GI endoscopy was accomplished without                            difficulty. The patient tolerated the procedure                            well. Scope In: 1:14:01 PM Scope Out: 1:26:21 PM Total Procedure Duration: 0 hours 12 minutes 20 seconds  Findings:      The hypopharynx was normal.      A single 3 mm polyp was found 20 cm from the incisors. Biopsies were       taken with a cold forceps for histology.      The exam of the esophagus was otherwise normal.      The Z-line was irregular and was found 38 cm from the incisors.      No endoscopic abnormality was evident in the esophagus to explain the       patient's complaint of dysphagia. It was decided, however, to proceed       with dilation of the entire esophagus. The scope was withdrawn. Dilation       was performed with a Maloney dilator with no resistance at 63 Fr. The  dilation site was examined following endoscope reinsertion and showed no       change and no bleeding, mucosal tear or perforation.      The entire examined stomach was normal.      The duodenal bulb and second portion of the duodenum were normal. Impression:               - Normal hypopharynx.                           - Esophageal polyp was found. Biopsied/ablated post                            dilation.                           - Z-line irregular, 38 cm from the incisors.                           - No endoscopic esophageal abnormality to explain                            patient's dysphagia. Esophagus dilated. Dilated.                           - Normal  stomach.                           - Normal duodenal bulb and second portion of the                            duodenum. Moderate Sedation:      Moderate (conscious) sedation was administered by the endoscopy nurse       and supervised by the endoscopist. The following parameters were       monitored: oxygen saturation, heart rate, blood pressure, CO2       capnography and response to care. Total physician intraservice time was       17 minutes. Recommendation:           - Patient has a contact number available for                            emergencies. The signs and symptoms of potential                            delayed complications were discussed with the                            patient. Return to normal activities tomorrow.                            Written discharge instructions were provided to the                            patient.                           - Resume  previous diet today.                           - Continue present medications.                           - No aspirin, ibuprofen, naproxen, or other                            non-steroidal anti-inflammatory drugs for 1 day.                           - Await pathology results. Procedure Code(s):        --- Professional ---                           [phone], Esophagogastroduodenoscopy, flexible,                            transoral; with biopsy, single or multiple                           43450, Dilation of esophagus, by unguided sound or                            bougie, single or multiple passes                           G0500, Moderate sedation services provided by the                            same physician or other qualified health care                            professional performing a gastrointestinal                            endoscopic service that sedation supports,                            requiring the presence of an independent trained                            observer to assist in the  monitoring of the                            patient's level of consciousness and physiological                            status; initial 15 minutes of intra-service time;                            patient age 51 years or older (additional time may                            be  reported with [phone], as appropriate) Diagnosis Code(s):        --- Professional ---                           K22.8, Other specified diseases of esophagus                           R13.14, Dysphagia, pharyngoesophageal phase                           K21.9, Gastro-esophageal reflux disease without                            esophagitis CPT copyright 2019 American Medical Association. All rights reserved. The codes documented in this report are preliminary and upon coder review may  be revised to meet current compliance requirements. Hildred Laser, MD Hildred Laser, MD 04/24/2020 2:00:30 PM This report has been signed electronically. Number of Addenda: 0

## 2020-04-24 NOTE — H&P (Signed)
Donna Dunn is an 69 y.o. female.   Chief Complaint: Patient is here for esophagogastroduodenoscopy with esophageal dilation and colonoscopy. HPI: Patient is 69 year old Caucasian female who has chronic GERD and has been on PPI for at least 15 years has been experiencing intermittent dysphagia to solids.  She points to suprasternal area site of bolus obstruction.  Sometimes she regurgitated food.  She denies nausea vomiting hematemesis or melena.  She feels heartburn is well controlled with omeprazole.  She states she took pantoprazole for few weeks but it did not work.  She denies change in bowel habits or rectal bleeding.  She is also undergoing screening colonoscopy.  Last examination was normal 10 years ago. Family history is negative for CRC.  Past Medical History:  Diagnosis Date  . Anxiety   . Avascular necrosis of metatarsal bone ([place])   . Bilateral sciatica   . DDD (degenerative disc disease), cervical   . DDD (degenerative disc disease), lumbar   . Fasting hyperglycemia   . GERD (gastroesophageal reflux disease)   . Hyperlipidemia   . Hypertension   . Hypothyroidism   . Multinodular goiter    left  . Osteoarthritis of right knee   . Osteoporosis of lumbar spine   . Overweight (BMI 25.0-29.9)   . Vitamin D deficiency     Past Surgical History:  Procedure Laterality Date  . ABDOMINAL HYSTERECTOMY     complete  . APPENDECTOMY    . COLONOSCOPY    . ESOPHAGOGASTRODUODENOSCOPY    . INNER EAR SURGERY    . MASTOIDECTOMY Bilateral   . THYROIDECTOMY, PARTIAL Right   . TONSILLECTOMY      Family History  Problem Relation Age of Onset  . CVA Mother   . CAD Mother   . Congestive Heart Failure Father   . Other Father        chronic lymphocytic leukemia  . Hypertension Sister   . CAD Brother        s/p CABG  . Prostate cancer Brother   . Other Sister        fatty liver   Social History:  reports that she has never smoked. She has never used smokeless tobacco. She reports  that she does not drink alcohol and does not use drugs.  Allergies:  Allergies  Allergen Reactions  . Compazine [Prochlorperazine Edisylate] Other (See Comments)    seizures    Medications Prior to Admission  Medication Sig Dispense Refill  . aspirin-acetaminophen-caffeine (EXCEDRIN MIGRAINE) 250-250-65 MG tablet Take 1-2 tablets by mouth every 8 (eight) hours as needed for headache or migraine.    Marland Kitchen atorvastatin (LIPITOR) 20 MG tablet Take 20 mg by mouth every evening.   3  . Calcium Carbonate-Vitamin D (CALTRATE 600+D PO) Take 1 tablet by mouth daily.    . carvedilol (COREG) 6.25 MG tablet Take 6.25 mg by mouth 2 (two) times daily with a meal.    . cholecalciferol (VITAMIN D3) 25 MCG (1000 UNIT) tablet Take 2,000 Units by mouth daily.    Marland Kitchen ibuprofen (ADVIL) 200 MG tablet Take 400 mg by mouth every 6 (six) hours as needed for headache or moderate pain.    Marland Kitchen levothyroxine (SYNTHROID, LEVOTHROID) 75 MCG tablet Take 75 mcg by mouth daily before breakfast.   0  . loratadine-pseudoephedrine (CLARITIN-D 24-HOUR) 10-240 MG 24 hr tablet Take 1 tablet by mouth daily as needed for allergies.    Marland Kitchen losartan (COZAAR) 25 MG tablet Take 25 mg by mouth daily.    Marland Kitchen  Multiple Vitamins-Minerals (ONE-A-DAY WOMENS 50+ ADVANTAGE PO) Take 1 tablet by mouth daily.    Marland Kitchen omeprazole (PRILOSEC) 40 MG capsule Take 40 mg by mouth daily.  4  . pantoprazole (PROTONIX) 40 MG tablet Take 1 tablet (40 mg total) by mouth daily. Take 30 min before meal. (Patient not taking: Reported on 04/16/2020) 30 tablet 3    Results for orders placed or performed during the hospital encounter of 04/24/20 (from the past 48 hour(s))  SARS Coronavirus 2 by RT PCR (hospital order, performed in [facility] hospital lab) Nasopharyngeal Nasopharyngeal Swab     Status: None   Collection Time: 04/24/20 11:18 AM   Specimen: Nasopharyngeal Swab  Result Value Ref Range   SARS Coronavirus 2 NEGATIVE NEGATIVE    Comment: (NOTE) SARS-CoV-2 target  nucleic acids are NOT DETECTED.  The SARS-CoV-2 RNA is generally detectable in upper and lower respiratory specimens during the acute phase of infection. The lowest concentration of SARS-CoV-2 viral copies this assay can detect is 250 copies / mL. A negative result does not preclude SARS-CoV-2 infection and should not be used as the sole basis for treatment or other patient management decisions.  A negative result may occur with improper specimen collection / handling, submission of specimen other than nasopharyngeal swab, presence of viral mutation(s) within the areas targeted by this assay, and inadequate number of viral copies (<250 copies / mL). A negative result must be combined with clinical observations, patient history, and epidemiological information.  Fact Sheet for Patients:   StrictlyIdeas.no  Fact Sheet for Healthcare Providers: BankingDealers.co.za  This test is not yet approved or  cleared by the [place] FDA and has been authorized for detection and/or diagnosis of SARS-CoV-2 by FDA under an Emergency Use Authorization (EUA).  This EUA will remain in effect (meaning this test can be used) for the duration of the COVID-19 declaration under Section 564(b)(1) of the Act, 21 U.S.C. section 360bbb-3(b)(1), unless the authorization is terminated or revoked sooner.  Performed at [facility], [address], [place], [place] [address]    No results found.  Review of Systems  Blood pressure (!) 148/75, pulse (!) 59, temperature 97.8 F (36.6 C), temperature source Oral, resp. rate 16, height 5\' 5"  (1.651 m), SpO2 100 %. Physical Exam HENT:     Mouth/Throat:     Mouth: Mucous membranes are moist.     Pharynx: Oropharynx is clear.  Eyes:     General: No scleral icterus.    Conjunctiva/sclera: Conjunctivae normal.  Cardiovascular:     Rate and Rhythm: Normal rate and regular rhythm.     Heart sounds: Normal heart  sounds. No murmur heard.   Pulmonary:     Effort: Pulmonary effort is normal.     Breath sounds: Normal breath sounds.  Abdominal:     Comments: Abdomen is symmetrical with lower midline and appendectomy scar in right lower quadrant of her abdomen.  Abdomen is soft and nontender with organomegaly or masses.  Musculoskeletal:        General: No swelling.     Cervical back: Neck supple.  Lymphadenopathy:     Cervical: No cervical adenopathy.  Skin:    General: Skin is warm and dry.  Neurological:     Mental Status: She is alert.      Assessment/Plan  Esophageal dysphagia in patient with chronic GERD. Esophagogastroduodenoscopy with esophageal dilation and average risk screening colonoscopy.  Hildred Laser, MD 04/24/2020, 1:03 PM

## 2020-04-24 NOTE — Op Note (Signed)
[facility] Patient Name: Donna Dunn Procedure Date: 04/24/2020 1:29 PM MRN: [id number] Date of Birth: 15-May-1951 Attending MD: Hildred Laser , MD CSN: [id number] Age: 69 Admit Type: Outpatient Procedure:                Colonoscopy Indications:              Screening for colorectal malignant neoplasm Providers:                Hildred Laser, MD, Gwenlyn Fudge RN, RN, Caprice Kluver Referring MD:             Cherly Anderson. Pomposini, MD Medicines:                Midazolam 1 mg IV Complications:            No immediate complications. Estimated Blood Loss:     Estimated blood loss was minimal. Procedure:                Pre-Anesthesia Assessment:                           - Prior to the procedure, a History and Physical                            was performed, and patient medications and                            allergies were reviewed. The patient's tolerance of                            previous anesthesia was also reviewed. The risks                            and benefits of the procedure and the sedation                            options and risks were discussed with the patient.                            All questions were answered, and informed consent                            was obtained. Prior Anticoagulants: The patient has                            taken no previous anticoagulant or antiplatelet                            agents except for aspirin. ASA Grade Assessment: II                            - A patient with mild systemic disease. After                            reviewing the risks and benefits, the patient was  deemed in satisfactory condition to undergo the                            procedure.                           After obtaining informed consent, the colonoscope                            was passed under direct vision. Throughout the                            procedure, the patient's blood pressure, pulse, and                             oxygen saturations were monitored continuously. The                            [id number]-[id number] ([id number]) was introduced through the                            anus and advanced to the the cecum, identified by                            appendiceal orifice and ileocecal valve. The                            colonoscopy was performed without difficulty. The                            patient tolerated the procedure well. The quality                            of the bowel preparation was adequate. The                            ileocecal valve, appendiceal orifice, and rectum                            were photographed. Scope In: 1:30:35 PM Scope Out: 1:51:57 PM Scope Withdrawal Time: 0 hours 11 minutes 56 seconds  Total Procedure Duration: 0 hours 21 minutes 22 seconds  Findings:      Skin tags were found on perianal exam.      A 4 mm polyp was found in the proximal transverse colon. The polyp was       sessile. Biopsies were taken with a cold forceps for histology. The       pathology specimen was placed into Bottle Number 2.      A single diverticulum was found in the distal sigmoid colon.      The exam was otherwise normal throughout the examined colon.      The retroflexed view of the distal rectum and anal verge was normal and       showed no anal or rectal abnormalities. Impression:               -  Perianal skin tags found on perianal exam.                           - One 4 mm polyp in the proximal transverse colon.                            Biopsied.                           - Diverticulosis in the distal sigmoid colon. Moderate Sedation:      Moderate (conscious) sedation was administered by the endoscopy nurse       and supervised by the endoscopist. The following parameters were       monitored: oxygen saturation, heart rate, blood pressure, CO2       capnography and response to care. Total physician intraservice time was       25 minutes. Recommendation:           -  Patient has a contact number available for                            emergencies. The signs and symptoms of potential                            delayed complications were discussed with the                            patient. Return to normal activities tomorrow.                            Written discharge instructions were provided to the                            patient.                           - Resume previous diet today.                           - Continue present medications.                           - No aspirin, ibuprofen, naproxen, or other                            non-steroidal anti-inflammatory drugs for 1 day.                           - Await pathology results.                           - Repeat colonoscopy is recommended. The                            colonoscopy date will be determined after pathology  results from today's exam become available for                            review. Procedure Code(s):        --- Professional ---                           [phone], Colonoscopy, flexible; with biopsy, single                            or multiple                           99153, Moderate sedation; each additional 15                            minutes intraservice time                           G0500, Moderate sedation services provided by the                            same physician or other qualified health care                            professional performing a gastrointestinal                            endoscopic service that sedation supports,                            requiring the presence of an independent trained                            observer to assist in the monitoring of the                            patient's level of consciousness and physiological                            status; initial 15 minutes of intra-service time;                            patient age 46 years or older (additional time may                             be reported with [phone], as appropriate) Diagnosis Code(s):        --- Professional ---                           Z12.11, Encounter for screening for malignant                            neoplasm of colon  K63.5, Polyp of colon                           K64.4, Residual hemorrhoidal skin tags                           K57.30, Diverticulosis of large intestine without                            perforation or abscess without bleeding CPT copyright 2019 American Medical Association. All rights reserved. The codes documented in this report are preliminary and upon coder review may  be revised to meet current compliance requirements. Hildred Laser, MD Hildred Laser, MD 04/24/2020 2:05:17 PM This report has been signed electronically. Number of Addenda: 0

## 2020-04-24 NOTE — Discharge Instructions (Signed)
No aspirin or NSAIDs for 24 hours. Resume usual medications and diet as before. No driving for 24 hours. Physician will call with biopsy results.      Upper Endoscopy, Adult, Care After This sheet gives you information about how to care for yourself after your procedure. Your health care provider may also give you more specific instructions. If you have problems or questions, contact your health care provider. What can I expect after the procedure? After the procedure, it is common to have:  A sore throat.  Mild stomach pain or discomfort.  Bloating.  Nausea. Follow these instructions at home:   Follow instructions from your health care provider about what to eat or drink after your procedure.  Return to your normal activities as told by your health care provider. Ask your health care provider what activities are safe for you.  Take over-the-counter and prescription medicines only as told by your health care provider.  Do not drive for 24 hours if you were given a sedative during your procedure.  Keep all follow-up visits as told by your health care provider. This is important. Contact a health care provider if you have:  A sore throat that lasts longer than one day.  Trouble swallowing. Get help right away if:  You vomit blood or your vomit looks like coffee grounds.  You have: ? A fever. ? Bloody, black, or tarry stools. ? A severe sore throat or you cannot swallow. ? Difficulty breathing. ? Severe pain in your chest or abdomen. Summary  After the procedure, it is common to have a sore throat, mild stomach discomfort, bloating, and nausea.  Do not drive for 24 hours if you were given a sedative during the procedure.  Follow instructions from your health care provider about what to eat or drink after your procedure.  Return to your normal activities as told by your health care provider. This information is not intended to replace advice given to you by your  health care provider. Make sure you discuss any questions you have with your health care provider. Document Revised: 01/24/2018 Document Reviewed: 01/02/2018 Elsevier Patient Education  [place].    Colonoscopy, Adult, Care After This sheet gives you information about how to care for yourself after your procedure. Your doctor may also give you more specific instructions. If you have problems or questions, call your doctor. What can I expect after the procedure? After the procedure, it is common to have:  A small amount of blood in your poop (stool) for 24 hours.  Some gas.  Mild cramping or bloating in your belly (abdomen). Follow these instructions at home: Eating and drinking   Drink enough fluid to keep your pee (urine) pale yellow.  Follow instructions from your doctor about what you cannot eat or drink.  Return to your normal diet as told by your doctor. Avoid heavy or fried foods that are hard to digest. Activity  Rest as told by your doctor.  Do not sit for a long time without moving. Get up to take short walks every 1-2 hours. This is important. Ask for help if you feel weak or unsteady.  Return to your normal activities as told by your doctor. Ask your doctor what activities are safe for you. To help cramping and bloating:   Try walking around.  Put heat on your belly as told by your doctor. Use the heat source that your doctor recommends, such as a moist heat pack or a heating pad. ?  Put a towel between your skin and the heat source. ? Leave the heat on for 20-30 minutes. ? Remove the heat if your skin turns bright red. This is very important if you are unable to feel pain, heat, or cold. You may have a greater risk of getting burned. General instructions  For the first 24 hours after the procedure: ? Do not drive or use machinery. ? Do not sign important documents. ? Do not drink alcohol. ? Do your daily activities more slowly than normal. ? Eat  foods that are soft and easy to digest.  Take over-the-counter or prescription medicines only as told by your doctor.  Keep all follow-up visits as told by your doctor. This is important. Contact a doctor if:  You have blood in your poop 2-3 days after the procedure. Get help right away if:  You have more than a small amount of blood in your poop.  You see large clumps of tissue (blood clots) in your poop.  Your belly is swollen.  You feel like you may vomit (nauseous).  You vomit.  You have a fever.  You have belly pain that gets worse, and medicine does not help your pain. Summary  After the procedure, it is common to have a small amount of blood in your poop. You may also have mild cramping and bloating in your belly.  For the first 24 hours after the procedure, do not drive or use machinery, do not sign important documents, and do not drink alcohol.  Get help right away if you have a lot of blood in your poop, feel like you may vomit, have a fever, or have more belly pain. This information is not intended to replace advice given to you by your health care provider. Make sure you discuss any questions you have with your health care provider. Document Revised: 02/26/2019 Document Reviewed: 02/26/2019 Elsevier Patient Education  [place].

## 2020-04-28 LAB — SURGICAL PATHOLOGY

## 2020-04-30 ENCOUNTER — Encounter (HOSPITAL_COMMUNITY): Payer: Self-pay | Admitting: Internal Medicine

## 2020-05-23 ENCOUNTER — Encounter (INDEPENDENT_AMBULATORY_CARE_PROVIDER_SITE_OTHER): Payer: Self-pay | Admitting: *Deleted

## 2021-05-12 ENCOUNTER — Ambulatory Visit (INDEPENDENT_AMBULATORY_CARE_PROVIDER_SITE_OTHER): Payer: Medicare Other | Admitting: Internal Medicine

## 2021-08-11 ENCOUNTER — Encounter (INDEPENDENT_AMBULATORY_CARE_PROVIDER_SITE_OTHER): Payer: Self-pay | Admitting: Internal Medicine

## 2021-08-11 ENCOUNTER — Other Ambulatory Visit: Payer: Self-pay

## 2021-08-11 ENCOUNTER — Ambulatory Visit (INDEPENDENT_AMBULATORY_CARE_PROVIDER_SITE_OTHER): Payer: Medicare Other | Admitting: Internal Medicine

## 2021-08-11 DIAGNOSIS — K219 Gastro-esophageal reflux disease without esophagitis: Secondary | ICD-10-CM | POA: Diagnosis not present

## 2021-08-11 NOTE — Progress Notes (Signed)
Presenting complaint;  Follow for chronic GERD.  Database and subjective:  Patient is 70 year old Caucasian female who is here for scheduled visit.  She has chronic GERD and history of dysphagia.  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  She did not have structural abnormality to her esophagus but she responded to esophageal dilation.  Colonoscopy revealed single diverticulum at sigmoid colon and a small polyp at transverse colon which was tubular adenoma.  She says she is doing well with dietary measures and omeprazole 20 mg daily.  She rarely has heartburn.  She says her bowels move almost daily.  She may skip a day occasionally.  She denies melena or rectal bleeding.  She has history of IBS but lately has not had any problems. She is very active.  She says she walks 30 to 45 minutes every day.  She says she takes Excedrin for sinus headache which is not often.  She is on Prolia for bone loss.  She states every time she is receive injection she has developed muscle and bone pains.  She will need to discuss it with prescribing physician whether or not she should continue therapy when the time comes.  Current Medications: Outpatient Encounter Medications as of 08/11/2021  Medication Sig   aspirin-acetaminophen-caffeine (EXCEDRIN MIGRAINE) 250-250-65 MG tablet Take 1-2 tablets by mouth every 8 (eight) hours as needed for headache or migraine.   atorvastatin (LIPITOR) 20 MG tablet Take 20 mg by mouth every evening.    Calcium Carbonate-Vitamin D (CALTRATE 600+D PO) Take 1 tablet by mouth daily.   carvedilol (COREG) 6.25 MG tablet Take 6.25 mg by mouth 2 (two) times daily with a meal.   cholecalciferol (VITAMIN D3) 25 MCG (1000 UNIT) tablet Take 2,000 Units by mouth daily.   ibuprofen (ADVIL) 200 MG tablet Take 400 mg by mouth every 6 (six) hours as needed for headache or moderate pain.   levothyroxine (SYNTHROID, LEVOTHROID)  75 MCG tablet Take 75 mcg by mouth daily before breakfast.    loratadine-pseudoephedrine (CLARITIN-D 24-HOUR) 10-240 MG 24 hr tablet Take 1 tablet by mouth daily as needed for allergies.   losartan (COZAAR) 25 MG tablet Take 25 mg by mouth daily.   Multiple Vitamins-Minerals (ONE-A-DAY WOMENS 50+ ADVANTAGE PO) Take 1 tablet by mouth daily.   omeprazole (PRILOSEC) 40 MG capsule Take 20 mg by mouth. Take one tablet in the evening   No facility-administered encounter medications on file as of 08/11/2021.     Objective: Blood pressure 126/76, pulse 72, temperature 98.9 F (37.2 C), temperature source Oral, height $RemoveBefo'5\' 5"'iKCpplpvZoO$  (1.651 m), weight 156 lb 14.4 oz (71.2 kg). Patient is alert and in no acute distress. Conjunctiva is pink. Sclera is nonicteric Oropharyngeal mucosa is normal. No neck masses or thyromegaly noted. Cardiac exam with regular rhythm normal S1 and S2. No murmur or gallop noted. Lungs are clear to auscultation. Abdomen is symmetrical soft and nontender with organomegaly or masses. No LE edema or clubbing noted.  Labs/studies Results:   CBC Latest Ref Rng & Units 12/13/2017 02/06/2011  WBC 4.0 - 10.5 K/uL 11.9(H) 7.0  Hemoglobin 12.0 - 15.0 g/dL 12.9 14.6  Hematocrit 36.0 - 46.0 % 38.5 40.7  Platelets 150 - 400 K/uL 304 307    CMP Latest Ref Rng & Units 12/13/2017 02/06/2011  Glucose 65 - 99 mg/dL 88 103(H)  BUN 6 - 20 mg/dL 12 14  Creatinine 0.44 - 1.00 mg/dL 0.69 0.68  Sodium 135 - 145 mmol/L 139 140  Potassium 3.5 - 5.1 mmol/L 3.3(L) 3.4(L)  Chloride 101 - 111 mmol/L 105 103  CO2 22 - 32 mmol/L 23 24  Calcium 8.9 - 10.3 mg/dL 9.4 10.9(H)  Total Protein 6.5 - 8.1 g/dL 7.0 8.7(H)  Total Bilirubin 0.3 - 1.2 mg/dL 1.0 0.6  Alkaline Phos 38 - 126 U/L 60 76  AST 15 - 41 U/L 29 22  ALT 14 - 54 U/L 67(H) 28    Hepatic Function Latest Ref Rng & Units 12/13/2017 02/06/2011  Total Protein 6.5 - 8.1 g/dL 7.0 8.7(H)  Albumin 3.5 - 5.0 g/dL 3.9 4.6  AST 15 - 41 U/L 29 22  ALT  14 - 54 U/L 67(H) 28  Alk Phosphatase 38 - 126 U/L 60 76  Total Bilirubin 0.3 - 1.2 mg/dL 1.0 0.6      Assessment:  #1.  Chronic GERD.  She is doing very well with antireflux measures and low-dose PPI.  We will try her on every other day schedule after the holidays and see how she does.  #2.  Patient is up-to-date 3 colorectal cancer screening.  She had small tubular adenoma removed in September 2021.  She should consider having another colonoscopy in 7 years from her time of exam which would be September 2028.  Plan:  Medication list updated. Continue antireflux measures as before. She will take omeprazole 20 mg every other day starting next month. She can take OTC Pepcid 20 mg on off days for breakthrough heartburn. If omeprazole every other day does not work she can go back to taking it daily. Patient will call office for an update in 2 to 3 months. Office visit in 1 year.

## 2021-08-11 NOTE — Patient Instructions (Signed)
Try taking omeprazole 20 mg every other day starting next month. Can take Pepcid OTC on off days if needed. If every other day omeprazole does not control heartburn can go back to taking it daily. Please call office with progress report in 2 to 3 months.

## 2024-03-25 ENCOUNTER — Encounter (HOSPITAL_COMMUNITY): Payer: Self-pay | Admitting: Emergency Medicine

## 2024-03-25 ENCOUNTER — Other Ambulatory Visit: Payer: Self-pay

## 2024-03-25 ENCOUNTER — Emergency Department (HOSPITAL_COMMUNITY)
Admission: EM | Admit: 2024-03-25 | Discharge: 2024-03-25 | Disposition: A | Discharge status: Home / Self Care | Attending: Emergency Medicine | Admitting: Emergency Medicine

## 2024-03-25 DIAGNOSIS — Z7982 Long term (current) use of aspirin: Secondary | ICD-10-CM | POA: Insufficient documentation

## 2024-03-25 DIAGNOSIS — R350 Frequency of micturition: Secondary | ICD-10-CM | POA: Diagnosis present

## 2024-03-25 DIAGNOSIS — N3000 Acute cystitis without hematuria: Secondary | ICD-10-CM

## 2024-03-25 LAB — URINALYSIS, ROUTINE W REFLEX MICROSCOPIC
Bilirubin Urine: NEGATIVE
Glucose, UA: NEGATIVE mg/dL
Ketones, ur: NEGATIVE mg/dL
Nitrite: NEGATIVE
Protein, ur: 100 mg/dL — AB
Specific Gravity, Urine: 1.004 — ABNORMAL LOW (ref 1.005–1.030)
WBC, UA: >50 WBC/hpf (ref 0–5)
pH: 7 (ref 5.0–8.0)

## 2024-03-25 MED ORDER — CEPHALEXIN 500 MG PO CAPS
500.0000 mg | ORAL_CAPSULE | Freq: Once | ORAL | Status: AC
  Administered 2024-03-25: 500 mg via ORAL
  Filled 2024-03-25: qty 1

## 2024-03-25 MED ORDER — PHENAZOPYRIDINE HCL 200 MG PO TABS: 200.0000 mg | ORAL_TABLET | Freq: Three times a day (TID) | ORAL | 0 refills | Status: AC

## 2024-03-25 MED ORDER — PHENAZOPYRIDINE HCL 100 MG PO TABS
200.0000 mg | ORAL_TABLET | Freq: Once | ORAL | Status: AC
  Administered 2024-03-25: 200 mg via ORAL
  Filled 2024-03-25: qty 2

## 2024-03-25 MED ORDER — CEPHALEXIN 500 MG PO CAPS: 500.0000 mg | ORAL_CAPSULE | Freq: Three times a day (TID) | ORAL | 0 refills | Status: AC

## 2024-03-25 NOTE — ED Provider Notes (Signed)
 [place] EMERGENCY DEPARTMENT AT [facility] Provider Note   CSN: [id number] Arrival date & time: 03/25/24  9864     Patient presents with: Urinary Frequency and Dysuria   Donna Dunn is a 73 y.o. female.   Patient is a 73 year old female with past medical history of hypertension, hyperlipidemia, hypothyroidism, GERD.  Patient presenting today with complaints of burning with urination, urinary frequency, and urinary urgency.  This has been ongoing for the past 2 days.  No fevers or chills.  She denies any back or abdominal pain.  No aggravating or alleviating factors.       Prior to Admission medications   Medication Sig Start Date End Date Taking? Authorizing Provider  aspirin-acetaminophen-caffeine (EXCEDRIN MIGRAINE) 250-250-65 MG tablet Take 1-2 tablets by mouth every 8 (eight) hours as needed for headache or migraine.    [provider]  atorvastatin (LIPITOR) 20 MG tablet Take 20 mg by mouth every evening.  09/18/17   [provider]  Calcium Carbonate-Vitamin D (CALTRATE 600+D PO) Take 1 tablet by mouth daily.    [provider]  carvedilol (COREG) 6.25 MG tablet Take 6.25 mg by mouth 2 (two) times daily with a meal.    [provider]  cholecalciferol (VITAMIN D3) 25 MCG (1000 UNIT) tablet Take 2,000 Units by mouth daily.    [provider]  ibuprofen (ADVIL) 200 MG tablet Take 400 mg by mouth every 6 (six) hours as needed for headache or moderate pain.    [provider]  levothyroxine (SYNTHROID, LEVOTHROID) 75 MCG tablet Take 75 mcg by mouth daily before breakfast.  09/23/17   [provider]  loratadine-pseudoephedrine (CLARITIN-D 24-HOUR) 10-240 MG 24 hr tablet Take 1 tablet by mouth daily as needed for allergies.    [provider]  losartan (COZAAR) 25 MG tablet Take 25 mg by mouth daily.    [provider]  Multiple Vitamins-Minerals (ONE-A-DAY WOMENS 50+ ADVANTAGE PO) Take 1 tablet  by mouth daily.    [provider]  omeprazole (PRILOSEC) 20 MG capsule Take 20 mg by mouth daily before lunch.    [provider]    Allergies: Compazine [prochlorperazine edisylate]    Review of Systems  All other systems reviewed and are negative.   Updated Vital Signs BP 137/85 (BP Location: Right Arm)   Pulse 74   Temp 98.1 F (36.7 C) (Oral)   Resp 17   Ht 5' 5 (1.651 m)   Wt 65.8 kg   SpO2 96%   BMI 24.13 kg/m   Physical Exam Vitals and nursing note reviewed.  Constitutional:      General: She is not in acute distress.    Appearance: She is well-developed. She is not diaphoretic.  HENT:     Head: Normocephalic and atraumatic.  Cardiovascular:     Rate and Rhythm: Normal rate and regular rhythm.     Heart sounds: No murmur heard.    No friction rub. No gallop.  Pulmonary:     Effort: Pulmonary effort is normal. No respiratory distress.     Breath sounds: Normal breath sounds. No wheezing.  Abdominal:     General: Bowel sounds are normal. There is no distension.     Palpations: Abdomen is soft.     Tenderness: There is no abdominal tenderness.  Musculoskeletal:        General: Normal range of motion.     Cervical back: Normal range of motion and neck supple.  Skin:  General: Skin is warm and dry.  Neurological:     General: No focal deficit present.     Mental Status: She is alert and oriented to person, place, and time.     (all labs ordered are listed, but only abnormal results are displayed) Labs Reviewed  URINALYSIS, ROUTINE W REFLEX MICROSCOPIC - Abnormal; Notable for the following components:      Result Value   APPearance CLOUDY (*)    Specific Gravity, Urine 1.004 (*)    Hgb urine dipstick LARGE (*)    Protein, ur 100 (*)    Leukocytes,Ua LARGE (*)    Bacteria, UA RARE (*)    All other components within normal limits    EKG: None  Radiology: No results found.   Procedures   Medications Ordered in the ED   cephALEXin  (KEFLEX ) capsule 500 mg (has no administration in time range)  phenazopyridine  (PYRIDIUM ) tablet 200 mg (has no administration in time range)                                    Medical Decision Making Amount and/or Complexity of Data Reviewed Labs: ordered.  Risk Prescription drug management.   Patient presenting with urinary symptoms as described in the HPI.  This is most consistent with a UTI.  Patient will be treated with Keflex , Pyridium , and follow-up as needed.     Final diagnoses:  None    ED Discharge Orders     None          Geroldine Berg, MD 03/25/24 [phone]

## 2024-03-25 NOTE — ED Notes (Signed)
 ED Provider at bedside.

## 2024-03-25 NOTE — ED Triage Notes (Signed)
 Pt with c/o burning with urination and urinary frequency since yesterday that is getting worse.

## 2024-03-25 NOTE — Discharge Instructions (Signed)
 Begin taking Keflex  and Pyridium  as prescribed.  Return to the emergency department if you develop severe abdominal pain, high fevers, or for other new and concerning symptoms.
